# Patient Record
Sex: Male | Born: 1945 | Race: White | Hispanic: No | Smoking: Former smoker
Health system: Southern US, Community
[De-identification: ages and names within clinical notes are randomized; demographics above are authoritative.]

## PROBLEM LIST (undated history)

## (undated) DIAGNOSIS — R0602 Shortness of breath: Secondary | ICD-10-CM

## (undated) DIAGNOSIS — M1990 Unspecified osteoarthritis, unspecified site: Secondary | ICD-10-CM

## (undated) DIAGNOSIS — K224 Dyskinesia of esophagus: Secondary | ICD-10-CM

## (undated) DIAGNOSIS — R011 Cardiac murmur, unspecified: Secondary | ICD-10-CM

## (undated) DIAGNOSIS — R21 Rash and other nonspecific skin eruption: Secondary | ICD-10-CM

## (undated) DIAGNOSIS — I10 Essential (primary) hypertension: Secondary | ICD-10-CM

## (undated) HISTORY — PX: OTHER SURGICAL HISTORY: SHX169

## (undated) HISTORY — PX: HERNIA REPAIR: SHX51

---
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
## 1999-09-01 ENCOUNTER — Other Ambulatory Visit: Admission: RE | Admit: 1999-09-01 | Discharge: 1999-09-01 | Payer: Self-pay | Admitting: Orthopedic Surgery

## 2004-07-27 ENCOUNTER — Ambulatory Visit: Payer: Self-pay | Admitting: Family Medicine

## 2004-08-09 ENCOUNTER — Ambulatory Visit: Payer: Self-pay | Admitting: Family Medicine

## 2005-02-21 ENCOUNTER — Ambulatory Visit: Payer: Self-pay | Admitting: Family Medicine

## 2005-03-01 ENCOUNTER — Ambulatory Visit: Payer: Self-pay | Admitting: Family Medicine

## 2005-03-06 ENCOUNTER — Ambulatory Visit: Payer: Self-pay | Admitting: Family Medicine

## 2005-06-19 ENCOUNTER — Ambulatory Visit: Payer: Self-pay | Admitting: Family Medicine

## 2005-07-18 ENCOUNTER — Ambulatory Visit: Payer: Self-pay | Admitting: Family Medicine

## 2011-12-05 ENCOUNTER — Encounter (HOSPITAL_COMMUNITY): Payer: Self-pay | Admitting: Pharmacy Technician

## 2011-12-05 ENCOUNTER — Encounter (HOSPITAL_COMMUNITY)
Admission: RE | Admit: 2011-12-05 | Discharge: 2011-12-05 | Disposition: A | Discharge status: Home / Self Care | Payer: BC Managed Care – PPO | Source: Ambulatory Visit | Attending: Orthopedic Surgery | Admitting: Orthopedic Surgery

## 2011-12-05 ENCOUNTER — Encounter (HOSPITAL_COMMUNITY): Payer: Self-pay

## 2011-12-05 ENCOUNTER — Ambulatory Visit (HOSPITAL_COMMUNITY)
Admission: RE | Admit: 2011-12-05 | Discharge: 2011-12-05 | Disposition: A | Discharge status: Home / Self Care | Payer: BC Managed Care – PPO | Source: Ambulatory Visit | Attending: Orthopedic Surgery | Admitting: Orthopedic Surgery

## 2011-12-05 DIAGNOSIS — Z01812 Encounter for preprocedural laboratory examination: Secondary | ICD-10-CM | POA: Insufficient documentation

## 2011-12-05 DIAGNOSIS — Z01818 Encounter for other preprocedural examination: Secondary | ICD-10-CM | POA: Insufficient documentation

## 2011-12-05 DIAGNOSIS — Z87891 Personal history of nicotine dependence: Secondary | ICD-10-CM | POA: Insufficient documentation

## 2011-12-05 HISTORY — DX: Shortness of breath: R06.02

## 2011-12-05 HISTORY — DX: Rash and other nonspecific skin eruption: R21

## 2011-12-05 HISTORY — DX: Essential (primary) hypertension: I10

## 2011-12-05 HISTORY — DX: Dyskinesia of esophagus: K22.4

## 2011-12-05 HISTORY — DX: Unspecified osteoarthritis, unspecified site: M19.90

## 2011-12-05 LAB — DIFFERENTIAL
Basophils Relative: 1 % (ref 0–1)
Lymphocytes Relative: 31 % (ref 12–46)
Lymphs Abs: 2.7 10*3/uL (ref 0.7–4.0)
Monocytes Relative: 8 % (ref 3–12)
Neutro Abs: 5 10*3/uL (ref 1.7–7.7)
Neutrophils Relative %: 58 % (ref 43–77)

## 2011-12-05 LAB — URINALYSIS, ROUTINE W REFLEX MICROSCOPIC
Ketones, ur: NEGATIVE mg/dL
Leukocytes, UA: NEGATIVE
Nitrite: NEGATIVE
Protein, ur: NEGATIVE mg/dL
pH: 6 (ref 5.0–8.0)

## 2011-12-05 LAB — APTT: aPTT: 30 seconds (ref 24–37)

## 2011-12-05 LAB — SURGICAL PCR SCREEN
MRSA, PCR: NEGATIVE
Staphylococcus aureus: NEGATIVE

## 2011-12-05 LAB — BASIC METABOLIC PANEL
BUN: 14 mg/dL (ref 6–23)
CO2: 29 mEq/L (ref 19–32)
Chloride: 99 mEq/L (ref 96–112)
GFR calc Af Amer: >90 mL/min (ref >90)
Potassium: 3.5 mEq/L (ref 3.5–5.1)

## 2011-12-05 LAB — CBC
Hemoglobin: 15.1 g/dL (ref 13.0–17.0)
MCHC: 33.8 g/dL (ref 30.0–36.0)
RBC: 4.84 MIL/uL (ref 4.22–5.81)
WBC: 8.7 10*3/uL (ref 4.0–10.5)

## 2011-12-05 NOTE — Pre-Procedure Instructions (Signed)
EKG REPORT AND OFFICE NOTE 12/03/11 WITH MEDICAL CLEARANC FOR LEFT HIP REPLACEMENT ON THIS CHART FROM DR. DOUGH IN [place]. CXR, CBC WITH DIFF, BMET, PT, PTT, UA WERE DONE TODAY PREOP.

## 2011-12-05 NOTE — Patient Instructions (Addendum)
YOUR SURGERY IS SCHEDULED ON:  Tuesday  4/2  AT 2:00 PM  REPORT TO [facility] AT:  11:30 AM      PHONE # FOR SHORT STAY IS [phone]  DO NOT EAT ANYTHING AFTER MIDNIGHT THE NIGHT BEFORE YOUR SURGERY.  YOU MAY BRUSH YOUR TEETH, -- NO FOOD, NO CHEWING GUM, NO MINTS, NO CANDIES, NO CHEWING TOBACCO.   YOU MAY HAVE CLEAR LIQUIDS TO DRINK FROM MIDNIGHT UNTIL 8:00 AM THE DAY OF YOUR SURGERY --LIKE WATER, BLACK COFFEE- NOTHING TO DRINK AFTER 8:00 AM.  PLEASE TAKE THE FOLLOWING MEDICATIONS THE AM OF SURGERY:  HYDROCODONE FOR PAIN AND LIPITOR.    IF YOU USE INHALERS--USE YOUR INHALERS THE AM OF YOUR SURGERY AND BRING INHALERS TO THE HOSPITAL -TAKE TO SURGERY.    IF YOU ARE DIABETIC:  DO NOT TAKE ANY DIABETIC MEDICATIONS THE AM OF YOUR SURGERY.  IF YOU TAKE INSULIN IN THE EVENINGS--PLEASE ONLY TAKE 1/2 NORMAL EVENING DOSE THE NIGHT BEFORE YOUR SURGERY.  NO INSULIN THE AM OF YOUR SURGERY.  IF YOU HAVE SLEEP APNEA AND USE CPAP OR BIPAP--PLEASE BRING THE MASK --NOT THE MACHINE-NOT THE TUBING   -JUST THE MASK. DO NOT BRING VALUABLES, MONEY, CREDIT CARDS.  CONTACT LENS, DENTURES / PARTIALS, GLASSES SHOULD NOT BE WORN TO SURGERY AND IN MOST CASES-HEARING AIDS WILL NEED TO BE REMOVED.  BRING YOUR GLASSES CASE, ANY EQUIPMENT NEEDED FOR YOUR CONTACT LENS. FOR PATIENTS ADMITTED TO THE HOSPITAL--CHECK OUT TIME THE DAY OF DISCHARGE IS 11:00 AM.  ALL INPATIENT ROOMS ARE PRIVATE - WITH BATHROOM, TELEPHONE, TELEVISION AND WIFI INTERNET. IF YOU ARE BEING DISCHARGED THE SAME DAY OF YOUR SURGERY--YOU CAN NOT DRIVE YOURSELF HOME--AND SHOULD NOT GO HOME ALONE BY TAXI OR BUS.  NO DRIVING OR OPERATING MACHINERY FOR 24 HOURS FOLLOWING ANESTHESIA / PAIN MEDICATIONS.                            SPECIAL INSTRUCTIONS:  CHLORHEXIDINE SOAP SHOWER (other brand names are Betasept and Hibiclens ) PLEASE SHOWER WITH CHLORHEXIDINE THE NIGHT BEFORE YOUR SURGERY AND THE AM OF YOUR SURGERY. DO NOT USE CHLORHEXIDINE ON  YOUR FACE OR PRIVATE AREAS--YOU MAY USE YOUR NORMAL SOAP THOSE AREAS AND YOUR NORMAL SHAMPOO.  WOMEN SHOULD AVOID SHAVING UNDER ARMS AND SHAVING LEGS 48 HOURS BEFORE USING CHLORHEXIDINE TO AVOID SKIN IRRITATION.  DO NOT USE IF ALLERGIC TO CHLORHEXIDINE.  PLEASE READ OVER ANY  FACT SHEETS THAT YOU WERE GIVEN: MRSA INFORMATION, BLOOD TRANSFUSION INFORMATION, INCENTIVE SPIROMETER INFORMATION.   PT NOTIFIED BY PHONE OF SURGERY TIME CHANGE TO 1:45 PM--PLEASE ARRIVE TO SHORT STAY AT 11:15 AM  AND NO FOOD AFTER MIDNIGHT--BUT CLEAR LIQUIDS FROM MN UNTIL 7:30 AM DAY OF SURGERY.  Pt notified by phone that his surgery time changed again to 13:25pm--he is to arrive to short stay tomorrow by 10:00am--reminded no food after midnight tonight--clear liquids from mn until 6:00 am day of surgery--nothing after 6:00am.

## 2011-12-12 NOTE — Progress Notes (Signed)
H&P performed 12/12/11 Dictation # [id number]

## 2011-12-13 NOTE — H&P (Signed)
NAMEALDAIR, [name] NO.:  [id number]  MEDICAL RECORD NO.:  [id number]  LOCATION:                               FACILITY:  [facility]  PHYSICIAN:  Madlyn Frankel. Charlann Boxer, M.D.  DATE OF BIRTH:  Apr 11, 1946  DATE OF ADMISSION:  12/18/2011 DATE OF DISCHARGE:                             HISTORY & PHYSICAL   DATE OF SURGERY:  December 18, 2011.  ADMITTING DIAGNOSIS:  End-stage osteoarthritis, left hip.  HISTORY OF PRESENT ILLNESS:  This is a 66 year old gentleman with a history of osteoarthritis of his left hip that failed conservative management.  After discussion of treatments, benefits, risks and options, the patient is now scheduled for a total hip arthroplasty of the left hip.  Note that his primary care doctor is Dr. Dina Rich. He is a candidate for trans-Cinnamic acid and dexamethasone, will receive that at surgery.  He plans on going home after surgery.  He is given his home medications of aspirin, iron MiraLAX, Robaxin and Colace.  ALLERGIES:  Drug allergies none.  CURRENT MEDICATIONS:  Atorvastatin 10 mg once a day, Exforge hydrochlorothiazide 10/320/25 one daily, aspirin 81 mg daily, vitamin C 1000 mg daily, Centrum Silver 1 daily and hydrocodone 5 mg 1-2 daily.  SERIOUS MEDICAL ILLNESSES:  Include hypertension, hyperlipidemia, history of shingles.  PREVIOUS SURGERIES:  Include Dupuytren's contracture of both hands and herniorrhaphy.  FAMILY HISTORY:  Negative.  SOCIAL HISTORY:  The patient is divorced.  He is a [employer] fast [employer].  He does not smoke or drink.  Again he plans on going home after surgery.  REVIEW OF SYSTEMS:  Central nervous system negative for headache, blurred vision, or dizziness.  Pulmonary negative for shortness of breath, PND, orthopnea;  positive for exertional shortness of breath. Cardiovascular negative for chest pain or palpitation.  GI negative for ulcers, hepatitis.  GU negative for urinary difficulties musculoskeletal in  HPI.  PHYSICAL EXAMINATION:  BP 135/79, pulse 72 and regular, respirations 14. HEENT:  Head normocephalic.  Nose patent.  Ears patent.  Pupils equal, round, reactive to light.  Throat without injection.  NECK:  Supple without adenopathy.  Carotids 2+ without bruit.  CHEST:  Clear to auscultation.  No rales or rhonchi.  Respirations 14.  HEART:  Regular rate and rhythm at 73 beats per minute without murmur.  ABDOMEN:  Soft. Active bowel sounds.  No masses or organomegaly.  NEUROLOGIC:  Alert, oriented to time, place, person.  Cranial nerves 2-12 grossly intact. EXTREMITIES:  Shows the left hip with markedly decreased range of motion with full extension, further flexion to 85 degrees, external rotation of 20 degrees,  internal rotation in neutral.  NEUROVASCULAR:  Status intact.  ASSESSMENT:  End-stage osteoarthritis, left hip.  PLAN:  Total hip arthroplasty by anterior approach, left hip.     [name], P.A.   ______________________________ Madlyn Frankel Charlann Boxer, M.D.    SJC/MEDQ  D:  12/12/2011  T:  12/13/2011  Job:  [id number]

## 2011-12-13 NOTE — Pre-Procedure Instructions (Signed)
PT NOTIFIED BY PHONE THAT HIS SURGERY TIME WAS MOVED UP 15 MINUTES  - FROM 2PM TO 1:45 PM--PLEASE ARRIVE TO SHORT STAY BY 11:15 AM --REMINDED NO FOOD AFTER MIDNIGHT NIGHT BEFORE SURGERY  AND JUST DO CLEAR LIQUIDS FROM MIDNIGHT UNTIL 7:30 AM DAY OF SURGERY--PT VOICED UNDERSTANDING.

## 2011-12-17 NOTE — Pre-Procedure Instructions (Signed)
Pt  Notified his surgery time has changed again--surgery is now at 12:25 pm and he is to arrive to short stay by 10:00am, no food after midnight tonight--clear liquids from mn until 6:00am--nothing to drink after 6am.  Pt voiced understanding and wrote down his instructions.

## 2011-12-18 ENCOUNTER — Encounter (HOSPITAL_COMMUNITY): Payer: Self-pay | Admitting: Orthopedic Surgery

## 2011-12-18 ENCOUNTER — Inpatient Hospital Stay (HOSPITAL_COMMUNITY)
Admission: RE | Admit: 2011-12-18 | Discharge: 2011-12-19 | DRG: 818 | Disposition: A | Discharge status: Home Health | Payer: BC Managed Care – PPO | Source: Ambulatory Visit | Attending: Orthopedic Surgery | Admitting: Orthopedic Surgery

## 2011-12-18 ENCOUNTER — Ambulatory Visit (HOSPITAL_COMMUNITY): Payer: BC Managed Care – PPO

## 2011-12-18 ENCOUNTER — Encounter (HOSPITAL_COMMUNITY): Payer: Self-pay | Admitting: Anesthesiology

## 2011-12-18 ENCOUNTER — Encounter (HOSPITAL_COMMUNITY)
Admission: RE | Disposition: A | Discharge status: Home Health | Payer: Self-pay | Source: Ambulatory Visit | Attending: Orthopedic Surgery

## 2011-12-18 ENCOUNTER — Ambulatory Visit (HOSPITAL_COMMUNITY): Payer: BC Managed Care – PPO | Admitting: Anesthesiology

## 2011-12-18 DIAGNOSIS — M1610 Unilateral primary osteoarthritis, unspecified hip: Principal | ICD-10-CM | POA: Diagnosis present

## 2011-12-18 DIAGNOSIS — Z96649 Presence of unspecified artificial hip joint: Secondary | ICD-10-CM

## 2011-12-18 DIAGNOSIS — I10 Essential (primary) hypertension: Secondary | ICD-10-CM | POA: Diagnosis present

## 2011-12-18 DIAGNOSIS — M169 Osteoarthritis of hip, unspecified: Principal | ICD-10-CM | POA: Diagnosis present

## 2011-12-18 HISTORY — PX: TOTAL HIP ARTHROPLASTY: SHX124

## 2011-12-18 SURGERY — ARTHROPLASTY, HIP, TOTAL, ANTERIOR APPROACH
Anesthesia: General | Site: Hip | Laterality: Left | Wound class: Clean

## 2011-12-18 MED ORDER — LIDOCAINE HCL (CARDIAC) 20 MG/ML IV SOLN
INTRAVENOUS | Status: DC | PRN
  Administered 2011-12-18: 100 mg via INTRAVENOUS

## 2011-12-18 MED ORDER — HYDROCHLOROTHIAZIDE 25 MG PO TABS
25.0000 mg | ORAL_TABLET | Freq: Every day | ORAL | Status: DC
  Administered 2011-12-19: 25 mg via ORAL
  Filled 2011-12-18 (×2): qty 1

## 2011-12-18 MED ORDER — FLEET ENEMA 7-19 GM/118ML RE ENEM: 1.0000 | ENEMA | Freq: Once | RECTAL | Status: AC | PRN

## 2011-12-18 MED ORDER — PROPOFOL 10 MG/ML IV EMUL
INTRAVENOUS | Status: DC | PRN
  Administered 2011-12-18: 200 mg via INTRAVENOUS

## 2011-12-18 MED ORDER — DIPHENHYDRAMINE HCL 25 MG PO CAPS: 25.0000 mg | ORAL_CAPSULE | Freq: Four times a day (QID) | ORAL | Status: DC | PRN

## 2011-12-18 MED ORDER — NEOSTIGMINE METHYLSULFATE 1 MG/ML IJ SOLN
INTRAMUSCULAR | Status: DC | PRN
  Administered 2011-12-18: 5 mg via INTRAVENOUS

## 2011-12-18 MED ORDER — CHLORHEXIDINE GLUCONATE 4 % EX LIQD: 60.0000 mL | Freq: Once | CUTANEOUS | Status: DC

## 2011-12-18 MED ORDER — CEFAZOLIN SODIUM-DEXTROSE 2-3 GM-% IV SOLR
2.0000 g | Freq: Four times a day (QID) | INTRAVENOUS | Status: AC
  Administered 2011-12-18 – 2011-12-19 (×3): 2 g via INTRAVENOUS
  Filled 2011-12-18 (×3): qty 50

## 2011-12-18 MED ORDER — SODIUM CHLORIDE 0.9 % IV SOLN
100.0000 mL/h | INTRAVENOUS | Status: DC
  Administered 2011-12-18 – 2011-12-19 (×2): 100 mL/h via INTRAVENOUS
  Filled 2011-12-18 (×8): qty 1000

## 2011-12-18 MED ORDER — AMLODIPINE BESYLATE 10 MG PO TABS
10.0000 mg | ORAL_TABLET | Freq: Every day | ORAL | Status: DC
  Administered 2011-12-19: 10 mg via ORAL
  Filled 2011-12-18 (×2): qty 1

## 2011-12-18 MED ORDER — ACETAMINOPHEN 10 MG/ML IV SOLN
INTRAVENOUS | Status: AC
  Filled 2011-12-18: qty 100

## 2011-12-18 MED ORDER — METOCLOPRAMIDE HCL 10 MG PO TABS: 5.0000 mg | ORAL_TABLET | Freq: Three times a day (TID) | ORAL | Status: DC | PRN

## 2011-12-18 MED ORDER — METHOCARBAMOL 100 MG/ML IJ SOLN
500.0000 mg | Freq: Four times a day (QID) | INTRAVENOUS | Status: DC | PRN
  Administered 2011-12-18 – 2011-12-19 (×2): 500 mg via INTRAVENOUS
  Filled 2011-12-18 (×2): qty 5

## 2011-12-18 MED ORDER — HYDROMORPHONE HCL PF 1 MG/ML IJ SOLN
0.5000 mg | INTRAMUSCULAR | Status: DC | PRN
  Administered 2011-12-18 – 2011-12-19 (×2): 1 mg via INTRAVENOUS
  Filled 2011-12-18 (×2): qty 1

## 2011-12-18 MED ORDER — PHENOL 1.4 % MT LIQD
1.0000 | OROMUCOSAL | Status: DC | PRN
  Filled 2011-12-18: qty 177

## 2011-12-18 MED ORDER — FENTANYL CITRATE 0.05 MG/ML IJ SOLN
INTRAMUSCULAR | Status: AC
  Filled 2011-12-18: qty 2

## 2011-12-18 MED ORDER — ONDANSETRON HCL 4 MG/2ML IJ SOLN: 4.0000 mg | Freq: Four times a day (QID) | INTRAMUSCULAR | Status: DC | PRN

## 2011-12-18 MED ORDER — DOCUSATE SODIUM 100 MG PO CAPS
100.0000 mg | ORAL_CAPSULE | Freq: Two times a day (BID) | ORAL | Status: DC
  Administered 2011-12-18 – 2011-12-19 (×2): 100 mg via ORAL
  Filled 2011-12-18 (×2): qty 1

## 2011-12-18 MED ORDER — PROMETHAZINE HCL 25 MG/ML IJ SOLN: 6.2500 mg | INTRAMUSCULAR | Status: DC | PRN

## 2011-12-18 MED ORDER — RIVAROXABAN 10 MG PO TABS
10.0000 mg | ORAL_TABLET | ORAL | Status: DC
  Administered 2011-12-19: 10 mg via ORAL
  Filled 2011-12-18 (×2): qty 1

## 2011-12-18 MED ORDER — CEFAZOLIN SODIUM-DEXTROSE 2-3 GM-% IV SOLR
2.0000 g | Freq: Once | INTRAVENOUS | Status: AC
  Administered 2011-12-18: 2 g via INTRAVENOUS

## 2011-12-18 MED ORDER — CEFAZOLIN SODIUM-DEXTROSE 2-3 GM-% IV SOLR
INTRAVENOUS | Status: AC
  Filled 2011-12-18: qty 50

## 2011-12-18 MED ORDER — AMLODIPINE BESYLATE 10 MG PO TABS
10.0000 mg | ORAL_TABLET | Freq: Once | ORAL | Status: DC
  Filled 2011-12-18: qty 1

## 2011-12-18 MED ORDER — METHOCARBAMOL 500 MG PO TABS
500.0000 mg | ORAL_TABLET | Freq: Four times a day (QID) | ORAL | Status: DC | PRN
  Administered 2011-12-18: 500 mg via ORAL
  Filled 2011-12-18: qty 1

## 2011-12-18 MED ORDER — AMLODIPINE-VALSARTAN-HCTZ 10-320-25 MG PO TABS: ORAL_TABLET | Freq: Every morning | ORAL | Status: DC

## 2011-12-18 MED ORDER — MENTHOL 3 MG MT LOZG
1.0000 | LOZENGE | OROMUCOSAL | Status: DC | PRN
  Filled 2011-12-18: qty 9

## 2011-12-18 MED ORDER — HYDROMORPHONE HCL PF 1 MG/ML IJ SOLN
0.2500 mg | INTRAMUSCULAR | Status: DC | PRN
Start: 2011-12-18 — End: 2011-12-18
  Administered 2011-12-18 (×4): 0.5 mg via INTRAVENOUS

## 2011-12-18 MED ORDER — DEXAMETHASONE SODIUM PHOSPHATE 10 MG/ML IJ SOLN: 10.0000 mg | Freq: Once | INTRAMUSCULAR | Status: DC

## 2011-12-18 MED ORDER — ATORVASTATIN CALCIUM 10 MG PO TABS
10.0000 mg | ORAL_TABLET | Freq: Every day | ORAL | Status: DC
  Administered 2011-12-19: 10 mg via ORAL
  Filled 2011-12-18 (×3): qty 1

## 2011-12-18 MED ORDER — EPHEDRINE SULFATE 50 MG/ML IJ SOLN
INTRAMUSCULAR | Status: DC | PRN
  Administered 2011-12-18: 5 mg via INTRAVENOUS

## 2011-12-18 MED ORDER — ACETAMINOPHEN 10 MG/ML IV SOLN
INTRAVENOUS | Status: DC | PRN
  Administered 2011-12-18: 1000 mg via INTRAVENOUS

## 2011-12-18 MED ORDER — NAPHAZOLINE HCL 0.1 % OP SOLN
1.0000 [drp] | Freq: Four times a day (QID) | OPHTHALMIC | Status: DC | PRN
  Filled 2011-12-18: qty 15

## 2011-12-18 MED ORDER — HYDROMORPHONE HCL PF 1 MG/ML IJ SOLN
INTRAMUSCULAR | Status: AC
  Filled 2011-12-18: qty 2

## 2011-12-18 MED ORDER — LACTATED RINGERS IV SOLN
INTRAVENOUS | Status: DC | PRN
  Administered 2011-12-18: 13:00:00 via INTRAVENOUS

## 2011-12-18 MED ORDER — FENTANYL CITRATE 0.05 MG/ML IJ SOLN
INTRAMUSCULAR | Status: DC | PRN
  Administered 2011-12-18 (×3): 50 ug via INTRAVENOUS
  Administered 2011-12-18: 100 ug via INTRAVENOUS

## 2011-12-18 MED ORDER — ONDANSETRON HCL 4 MG PO TABS: 4.0000 mg | ORAL_TABLET | Freq: Four times a day (QID) | ORAL | Status: DC | PRN

## 2011-12-18 MED ORDER — MIDAZOLAM HCL 5 MG/5ML IJ SOLN
INTRAMUSCULAR | Status: DC | PRN
  Administered 2011-12-18: 2 mg via INTRAVENOUS

## 2011-12-18 MED ORDER — IRBESARTAN 300 MG PO TABS
300.0000 mg | ORAL_TABLET | Freq: Every day | ORAL | Status: DC
  Administered 2011-12-19: 300 mg via ORAL
  Filled 2011-12-18 (×2): qty 1

## 2011-12-18 MED ORDER — HYDROCORTISONE 1 % EX CREA
1.0000 "application " | TOPICAL_CREAM | Freq: Two times a day (BID) | CUTANEOUS | Status: DC | PRN
  Filled 2011-12-18: qty 28

## 2011-12-18 MED ORDER — BISACODYL 5 MG PO TBEC: 5.0000 mg | DELAYED_RELEASE_TABLET | Freq: Every day | ORAL | Status: DC | PRN

## 2011-12-18 MED ORDER — DEXAMETHASONE SODIUM PHOSPHATE 10 MG/ML IJ SOLN
10.0000 mg | Freq: Once | INTRAMUSCULAR | Status: AC
  Administered 2011-12-19: 10 mg via INTRAVENOUS
  Filled 2011-12-18: qty 1

## 2011-12-18 MED ORDER — ZOLPIDEM TARTRATE 5 MG PO TABS: 5.0000 mg | ORAL_TABLET | Freq: Every evening | ORAL | Status: DC | PRN

## 2011-12-18 MED ORDER — GLYCOPYRROLATE 0.2 MG/ML IJ SOLN
INTRAMUSCULAR | Status: DC | PRN
  Administered 2011-12-18: 0.2 mg via INTRAVENOUS
  Administered 2011-12-18: .8 mg via INTRAVENOUS

## 2011-12-18 MED ORDER — 0.9 % SODIUM CHLORIDE (POUR BTL) OPTIME
TOPICAL | Status: DC | PRN
  Administered 2011-12-18: 1000 mL

## 2011-12-18 MED ORDER — DEXAMETHASONE SODIUM PHOSPHATE 10 MG/ML IJ SOLN
INTRAMUSCULAR | Status: DC | PRN
  Administered 2011-12-18: 10 mg via INTRAVENOUS

## 2011-12-18 MED ORDER — FENTANYL CITRATE 0.05 MG/ML IJ SOLN
25.0000 ug | INTRAMUSCULAR | Status: DC | PRN
  Administered 2011-12-18: 25 ug via INTRAVENOUS
  Administered 2011-12-18: 50 ug via INTRAVENOUS
  Administered 2011-12-18: 25 ug via INTRAVENOUS

## 2011-12-18 MED ORDER — FERROUS SULFATE 325 (65 FE) MG PO TABS
325.0000 mg | ORAL_TABLET | Freq: Three times a day (TID) | ORAL | Status: DC
  Administered 2011-12-18 – 2011-12-19 (×3): 325 mg via ORAL
  Filled 2011-12-18 (×3): qty 1

## 2011-12-18 MED ORDER — TRANEXAMIC ACID 100 MG/ML IV SOLN
1600.0000 mg | Freq: Once | INTRAVENOUS | Status: AC
  Administered 2011-12-18: 1600 mg via INTRAVENOUS
  Filled 2011-12-18: qty 16

## 2011-12-18 MED ORDER — ROCURONIUM BROMIDE 100 MG/10ML IV SOLN
INTRAVENOUS | Status: DC | PRN
  Administered 2011-12-18: 50 mg via INTRAVENOUS
  Administered 2011-12-18 (×2): 10 mg via INTRAVENOUS

## 2011-12-18 MED ORDER — ALUM & MAG HYDROXIDE-SIMETH 200-200-20 MG/5ML PO SUSP: 30.0000 mL | ORAL | Status: DC | PRN

## 2011-12-18 MED ORDER — METOCLOPRAMIDE HCL 5 MG/ML IJ SOLN: 5.0000 mg | Freq: Three times a day (TID) | INTRAMUSCULAR | Status: DC | PRN

## 2011-12-18 MED ORDER — HYDROCODONE-ACETAMINOPHEN 7.5-325 MG PO TABS
1.0000 | ORAL_TABLET | ORAL | Status: DC
  Administered 2011-12-18 – 2011-12-19 (×6): 2 via ORAL
  Filled 2011-12-18 (×6): qty 2

## 2011-12-18 MED ORDER — POLYETHYLENE GLYCOL 3350 17 G PO PACK
17.0000 g | PACK | Freq: Two times a day (BID) | ORAL | Status: DC
  Administered 2011-12-18 – 2011-12-19 (×2): 17 g via ORAL
  Filled 2011-12-18 (×2): qty 1

## 2011-12-18 MED ORDER — ONDANSETRON HCL 4 MG/2ML IJ SOLN
INTRAMUSCULAR | Status: DC | PRN
  Administered 2011-12-18: 4 mg via INTRAVENOUS

## 2011-12-18 MED ORDER — HYDROMORPHONE HCL PF 1 MG/ML IJ SOLN
INTRAMUSCULAR | Status: DC | PRN
  Administered 2011-12-18: 1 mg via INTRAVENOUS
  Administered 2011-12-18 (×2): 0.5 mg via INTRAVENOUS

## 2011-12-18 SURGICAL SUPPLY — 38 items
BAG ZIPLOCK 12X15 (MISCELLANEOUS) ×4 IMPLANT
BLADE SAW SGTL 18X1.27X75 (BLADE) ×2 IMPLANT
CELLS DAT CNTRL 66122 CELL SVR (MISCELLANEOUS) ×1 IMPLANT
CLOTH BEACON ORANGE TIMEOUT ST (SAFETY) ×2 IMPLANT
DERMABOND ADVANCED (GAUZE/BANDAGES/DRESSINGS) ×1
DERMABOND ADVANCED .7 DNX12 (GAUZE/BANDAGES/DRESSINGS) ×1 IMPLANT
DRAPE C-ARM 42X72 X-RAY (DRAPES) ×2 IMPLANT
DRAPE STERI IOBAN 125X83 (DRAPES) ×2 IMPLANT
DRAPE U-SHAPE 47X51 STRL (DRAPES) ×6 IMPLANT
DRSG AQUACEL AG ADV 3.5X10 (GAUZE/BANDAGES/DRESSINGS) ×2 IMPLANT
DRSG TEGADERM 4X4.75 (GAUZE/BANDAGES/DRESSINGS) ×2 IMPLANT
DURAPREP 26ML APPLICATOR (WOUND CARE) ×2 IMPLANT
ELECT BLADE TIP CTD 4 INCH (ELECTRODE) ×2 IMPLANT
ELECT REM PT RETURN 9FT ADLT (ELECTROSURGICAL) ×2
ELECTRODE REM PT RTRN 9FT ADLT (ELECTROSURGICAL) ×1 IMPLANT
EVACUATOR 1/8 PVC DRAIN (DRAIN) ×2 IMPLANT
FACESHIELD LNG OPTICON STERILE (SAFETY) ×8 IMPLANT
GAUZE SPONGE 2X2 8PLY STRL LF (GAUZE/BANDAGES/DRESSINGS) ×1 IMPLANT
GLOVE BIOGEL PI IND STRL 7.5 (GLOVE) ×1 IMPLANT
GLOVE BIOGEL PI IND STRL 8 (GLOVE) ×1 IMPLANT
GLOVE BIOGEL PI INDICATOR 7.5 (GLOVE) ×1
GLOVE BIOGEL PI INDICATOR 8 (GLOVE) ×1
GLOVE ECLIPSE 8.0 STRL XLNG CF (GLOVE) ×2 IMPLANT
GLOVE ORTHO TXT STRL SZ7.5 (GLOVE) ×4 IMPLANT
GOWN BRE IMP PREV XXLGXLNG (GOWN DISPOSABLE) ×4 IMPLANT
GOWN STRL NON-REIN LRG LVL3 (GOWN DISPOSABLE) ×2 IMPLANT
KIT BASIN OR (CUSTOM PROCEDURE TRAY) ×2 IMPLANT
PACK TOTAL JOINT (CUSTOM PROCEDURE TRAY) ×2 IMPLANT
PADDING CAST COTTON 6X4 STRL (CAST SUPPLIES) ×2 IMPLANT
RTRCTR WOUND ALEXIS 18CM MED (MISCELLANEOUS) ×2
SPONGE GAUZE 2X2 STER 10/PKG (GAUZE/BANDAGES/DRESSINGS) ×1
SUCTION FRAZIER 12FR DISP (SUCTIONS) ×2 IMPLANT
SUT MNCRL AB 4-0 PS2 18 (SUTURE) ×2 IMPLANT
SUT VIC AB 1 CT1 36 (SUTURE) ×8 IMPLANT
SUT VIC AB 2-0 CT1 27 (SUTURE) ×3
SUT VIC AB 2-0 CT1 TAPERPNT 27 (SUTURE) ×3 IMPLANT
TOWEL OR 17X26 10 PK STRL BLUE (TOWEL DISPOSABLE) ×4 IMPLANT
TRAY FOLEY CATH 14FRSI W/METER (CATHETERS) ×2 IMPLANT

## 2011-12-18 NOTE — Progress Notes (Signed)
Pt informed that his surgery will be delayed. Will keep pt and family updated when holding area informs [place]. They verbalize understanding.

## 2011-12-18 NOTE — Transfer of Care (Signed)
Immediate Anesthesia Transfer of Care Note  Patient: [name]  Procedure(s) Performed: Procedure(s) (LRB): TOTAL HIP ARTHROPLASTY ANTERIOR APPROACH (Left)  Patient Location: PACU  Anesthesia Type: General  Level of Consciousness: awake, alert , oriented and patient cooperative  Airway & Oxygen Therapy: Patient Spontanous Breathing and Patient connected to face mask oxygen  Post-op Assessment: Report given to PACU RN, Post -op Vital signs reviewed and stable and Patient moving all extremities  Post vital signs: Reviewed and stable  Complications: No apparent anesthesia complications

## 2011-12-18 NOTE — Anesthesia Preprocedure Evaluation (Addendum)
Anesthesia Evaluation  Patient identified by MRN, date of birth, ID band Patient awake    Reviewed: Allergy & Precautions, H&P , NPO status , Patient's Chart, lab work & pertinent test results  Airway Mallampati: II TM Distance: >3 FB Neck ROM: Full    Dental No notable dental hx.    Pulmonary neg pulmonary ROS,  breath sounds clear to auscultation  Pulmonary exam normal       Cardiovascular hypertension, Pt. on medications Rhythm:Regular Rate:Normal     Neuro/Psych negative neurological ROS  negative psych ROS   GI/Hepatic negative GI ROS, Neg liver ROS,   Endo/Other  negative endocrine ROS  Renal/GU negative Renal ROS  negative genitourinary   Musculoskeletal negative musculoskeletal ROS (+)   Abdominal   Peds negative pediatric ROS (+)  Hematology negative hematology ROS (+)   Anesthesia Other Findings   Reproductive/Obstetrics negative OB ROS                           Anesthesia Physical Anesthesia Plan  ASA: II  Anesthesia Plan: General   Post-op Pain Management:    Induction: Intravenous  Airway Management Planned: Oral ETT  Additional Equipment:   Intra-op Plan:   Post-operative Plan: Extubation in OR  Informed Consent: I have reviewed the patients History and Physical, chart, labs and discussed the procedure including the risks, benefits and alternatives for the proposed anesthesia with the patient or authorized representative who has indicated his/her understanding and acceptance.   Dental advisory given  Plan Discussed with: CRNA  Anesthesia Plan Comments:         Anesthesia Quick Evaluation  

## 2011-12-18 NOTE — Op Note (Signed)
NAME:  [name]                ACCOUNT NO.: [id number]      MEDICAL RECORD NO.: [id number]      FACILITY:  [facility]      PHYSICIAN:  Vaani Morren D  DATE OF BIRTH:  Nov 29, 1945     DATE OF PROCEDURE:  12/18/2011                                 OPERATIVE REPORT         PREOPERATIVE DIAGNOSIS: Left  hip osteoarthritis.      POSTOPERATIVE DIAGNOSIS:  Left hip osteoarthritis.      PROCEDURE:  Left total hip replacement through an anterior approach   utilizing DePuy THR system, component size 54 pinnacle cup, a size 54 neutral   Altrex liner, a size 9Hi Tri Lock stem with a 36+1.5 delta ceramic   ball.      SURGEON:  Madlyn Frankel. Charlann Boxer, M.D.      ASSISTANT:  Lanney Gins, PA      ANESTHESIA:  General.      SPECIMENS:  None.      COMPLICATIONS:  None.      BLOOD LOSS:  350 cc     DRAINS:  One Hemovac.      INDICATION OF THE PROCEDURE:  [name] is a 66 y.o. male who had   presented to office for evaluation of left hip pain.  Radiographs revealed   progressive degenerative changes with bone-on-bone   articulation to the  hip joint.  The patient had painful limited range of   motion significantly affecting their overall quality of life.  The patient was failing to    respond to conservative measures, and at this point was ready   to proceed with more definitive measures.  The patient has noted progressive   degenerative changes in his hip, progressive problems and dysfunction   with regarding the hip prior to surgery.  Consent was obtained for   benefit of pain relief.  Specific risk of infection, DVT, component   failure, dislocation, need for revision surgery, as well discussion of   the anterior versus posterior approach were reviewed.  Consent was   obtained for benefit of anterior pain relief through an anterior   approach.      PROCEDURE IN DETAIL:  The patient was brought to operative theater.   Once adequate anesthesia, preoperative antibiotics, 2gm Ancef  administered.   The patient was positioned supine on the OSI Hanna table.  Once adequate   padding of boney process was carried out, we had predraped out the hip, and  used fluoroscopy to confirm orientation of the pelvis and position.      The left hip was then prepped and draped from proximal iliac crest to   mid thigh with shower curtain technique.      Time-out was performed identifying the patient, planned procedure, and   extremity.     An incision was then made 2 cm distal and lateral to the   anterior superior iliac spine extending over the orientation of the   tensor fascia lata muscle and sharp dissection was carried down to the   fascia of the muscle and protractor placed in the soft tissues.      The fascia was then incised.  The muscle belly was identified and swept   laterally  and retractor placed along the superior neck.  Following   cauterization of the circumflex vessels and removing some pericapsular   fat, a second cobra retractor was placed on the inferior neck.  A third   retractor was placed on the anterior acetabulum after elevating the   anterior rectus.  A L-capsulotomy was along the line of the   superior neck to the trochanteric fossa, then extended proximally and   distally.  Tag sutures were placed and the retractors were then placed   intracapsular.  We then identified the trochanteric fossa and   orientation of my neck cut, confirmed this radiographically   and then made a neck osteotomy with the femur on traction.  The femoral   head was removed without difficulty or complication.  Traction was let   off and retractors were placed posterior and anterior around the   acetabulum.      The labrum and foveal tissue were debrided.  I began reaming with a 45mm   reamer and reamed up to 53mm reamer with good bony bed preparation and a 54   cup was chosen.  The final 54mm Pinnacle cup was then impacted under fluoroscopy  to confirm the depth of penetration and  orientation with respect to   abduction.  A screw was placed followed by the hole eliminator.  The final   36+4 Altrex liner was impacted with good visualized rim fit.  The cup was positioned anatomically within the acetabular portion of the pelvis.      At this point, the femur was rolled at 80 degrees.  Further capsule was   released off the inferior aspect of the femoral neck.  I then   released the superior capsule proximally.  The hook was placed laterally   along the femur and elevated manually and held in position with the bed   hook.  The leg was then extended and adducted with the leg rolled to 100   degrees of external rotation.  Once the proximal femur was fully   exposed, I used a box osteotome to set orientation.  I then began   broaching with the starting chili pepper broach and passed this by hand and then broached up to 9.  With the 9 broach in place I chose a high offset neck, a 36+1.5 trial ball and did a trial reduction.  The offset was appropriate, leg lengths   appeared to be equal, confirmed radiographically.   Given these findings, I went ahead and dislocated the hip, repositioned all   retractors and positioned the right hip in the extended and abducted position.  The final 9 Hi Tri Lock stem was   chosen and it was impacted down to the level of neck cut.  Based on this   and the trial reduction, a 36+1.5 delta ceramic ball was chosen and   impacted onto a clean and dry trunnion, and the hip was reduced.  The   hip had been irrigated throughout the case again at this point.  I did   reapproximate the superior capsular leaflet to the anterior leaflet   using #1 Vicryl, placed a medium Hemovac drain deep.  The fascia of the   tensor fascia lata muscle was then reapproximated using #1 Vicryl.  The   remaining wound was closed with 2-0 Vicryl and running 4-0 Monocryl.   The hip was cleaned, dried, and dressed sterilely using Dermabond and   Aquacel dressing.  Drain site  dressed separately.  She was then brought   to recovery room in stable condition tolerating the procedure well.    Danae Orleans, PA-C was present for the entirety of the case involved from   preoperative positioning, perioperative retractor management, general   facilitation of the case, as well as primary wound closure as assistant.            Pietro Cassis Alvan Dame, M.D.            MDO/MEDQ  D:  07/10/2011  T:  07/10/2011  Job:  [id number]      Electronically Signed by Paralee Cancel M.D. on 07/16/2011 09:15:38 AM

## 2011-12-18 NOTE — Interval H&P Note (Signed)
History and Physical Interval Note:  12/18/2011 1:13 PM  [name]  has presented today for surgery, with the diagnosis of Osteoarthritis of Left Hip  The various methods of treatment have been discussed with the patient and family. After consideration of risks, benefits and other options for treatment, the patient has consented to  Procedure(s) (LRB):LEFT TOTAL HIP ARTHROPLASTY ANTERIOR APPROACH (Left) as a surgical intervention .  The patients' history has been reviewed, patient examined, no change in status, stable for surgery.  I have reviewed the patients' chart and labs.  Questions were answered to the patient's satisfaction.     Shelda Pal

## 2011-12-18 NOTE — Preoperative (Signed)
Beta Blockers   Reason not to administer Beta Blockers:Not Applicable 

## 2011-12-18 NOTE — Anesthesia Postprocedure Evaluation (Signed)
  Anesthesia Post-op Note  Patient: [name]  Procedure(s) Performed: Procedure(s) (LRB): TOTAL HIP ARTHROPLASTY ANTERIOR APPROACH (Left)  Patient Location: PACU  Anesthesia Type: General  Level of Consciousness: awake and alert   Airway and Oxygen Therapy: Patient Spontanous Breathing  Post-op Pain: mild  Post-op Assessment: Post-op Vital signs reviewed,, Respiratory Function Stable, Patent Airway and No signs of Nausea or vomiting  Post-op Vital Signs: stable  Complications: No apparent anesthesia complications/awaiting cardiac eval

## 2011-12-18 NOTE — Progress Notes (Signed)
hemovac drain in place upon arrival to pacu Operating room nurse to enter drain into lda's

## 2011-12-19 LAB — CBC
HCT: 39.4 % (ref 39.0–52.0)
Hemoglobin: 13.3 g/dL (ref 13.0–17.0)
MCHC: 33.8 g/dL (ref 30.0–36.0)
MCV: 91.8 fL (ref 78.0–100.0)
RDW: 12.6 % (ref 11.5–15.5)
WBC: 17.4 10*3/uL — ABNORMAL HIGH (ref 4.0–10.5)

## 2011-12-19 LAB — BASIC METABOLIC PANEL
BUN: 8 mg/dL (ref 6–23)
Chloride: 101 mEq/L (ref 96–112)
Creatinine, Ser: 0.62 mg/dL (ref 0.50–1.35)
GFR calc Af Amer: >90 mL/min (ref >90)
Glucose, Bld: 136 mg/dL — ABNORMAL HIGH (ref 70–99)
Potassium: 3.8 mEq/L (ref 3.5–5.1)

## 2011-12-19 MED ORDER — POLYETHYLENE GLYCOL 3350 17 G PO PACK: 17.0000 g | PACK | Freq: Two times a day (BID) | ORAL | Status: AC

## 2011-12-19 MED ORDER — DIPHENHYDRAMINE HCL 25 MG PO CAPS: 25.0000 mg | ORAL_CAPSULE | Freq: Four times a day (QID) | ORAL | Status: DC | PRN

## 2011-12-19 MED ORDER — ASPIRIN EC 325 MG PO TBEC: 325.0000 mg | DELAYED_RELEASE_TABLET | Freq: Two times a day (BID) | ORAL | Status: AC

## 2011-12-19 MED ORDER — FERROUS SULFATE 325 (65 FE) MG PO TABS: 325.0000 mg | ORAL_TABLET | Freq: Three times a day (TID) | ORAL | Status: DC

## 2011-12-19 MED ORDER — METHOCARBAMOL 500 MG PO TABS: 500.0000 mg | ORAL_TABLET | Freq: Four times a day (QID) | ORAL | Status: AC | PRN

## 2011-12-19 MED ORDER — DSS 100 MG PO CAPS: 100.0000 mg | ORAL_CAPSULE | Freq: Two times a day (BID) | ORAL | Status: AC

## 2011-12-19 MED ORDER — HYDROCODONE-ACETAMINOPHEN 7.5-325 MG PO TABS: 1.0000 | ORAL_TABLET | ORAL | Status: AC | PRN

## 2011-12-19 NOTE — Evaluation (Signed)
Physical Therapy Evaluation Patient Details Name: [name] MRN: [id number] DOB: 03/03/1946 Today's Date: 12/19/2011  Problem List:  Patient Active Problem List  Diagnoses  . S/P left THA, AA    Past Medical History:  Past Medical History  Diagnosis Date  . Hypertension   . Spastic esophagus     HX OF CHEST PAIN--STATES WENT TO HOSPTIAL YRS AGO--TOLD HEART OK--PAIN ATTRIBUTED TO SPASMS OF ESOPHAGUS  . Shortness of breath     JUST WITH EXERTION  . Arthritis     PAIN AND OA LEFT HIP--SOME PROBLEMS WITH RT HIP BUT NOTHING LIKE THE LEFT HIP  . Rash, skin      RASH ON LEGS--ITCHING -EVERY WINTER.   Past Surgical History:  Past Surgical History  Procedure Date  . Hernia repair     LAPAROSCOPIC INGUINAL HERNIA REPAIR WITH MESH- RIGHT  . Dupyentrens contracture repair     RIGHT HAND REPAIR X 2 AND LEFT HAND X 1    PT Assessment/Plan/Recommendation PT Assessment Clinical Impression Statement: Pt with L THR presents with decreased L LE strength/Rom and decreased funtional mobility. PT Recommendation/Assessment: Patient will need skilled PT in the acute care venue PT Problem List: Decreased strength;Decreased range of motion;Decreased activity tolerance;Decreased mobility;Decreased knowledge of use of DME;Pain PT Therapy Diagnosis : Difficulty walking PT Plan PT Frequency: 7X/week PT Treatment/Interventions: DME instruction;Gait training;Stair training;Functional mobility training;Therapeutic activities;Therapeutic exercise;Patient/family education PT Recommendation Follow Up Recommendations: Home health PT Equipment Recommended: None recommended by PT PT Goals  Acute Rehab PT Goals PT Goal Formulation: With patient Time For Goal Achievement: 7 days Pt will go Supine/Side to Sit: with supervision PT Goal: Supine/Side to Sit - Progress: Goal set today Pt will go Sit to Supine/Side: with supervision PT Goal: Sit to Supine/Side - Progress: Goal set today Pt will go Sit to  Stand: with supervision PT Goal: Sit to Stand - Progress: Goal set today Pt will go Stand to Sit: with supervision PT Goal: Stand to Sit - Progress: Goal set today Pt will Ambulate: >150 feet;with supervision;with rolling walker PT Goal: Ambulate - Progress: Goal set today Pt will Go Up / Down Stairs: Flight;with min assist;with least restrictive assistive device PT Goal: Up/Down Stairs - Progress: Goal set today  PT Evaluation Precautions/Restrictions  Restrictions Weight Bearing Restrictions: No Prior Functioning  Home Living Lives With: [name] Help From:  (staying with sister and brother in law.) Type of Home: House Home Layout: Multi-level;Able to live on main level with bedroom/bathroom Home Access: Ramped entrance Entrance Stairs-Rails: Left Entrance Stairs-Number of Steps: 12 Bathroom Shower/Tub: Health visitor: Standard Bathroom Accessibility: Yes How Accessible: Accessible via walker Home Adaptive Equipment: Dan Humphreys - four wheeled Prior Function Level of Independence: Independent with basic ADLs;Independent with transfers;Independent with homemaking with ambulation;Independent with gait Able to Take Stairs?: Yes Driving: Yes Vocation: Full time employment Cognition Cognition Arousal/Alertness: Awake/alert Overall Cognitive Status: Appears within functional limits for tasks assessed Orientation Level: Oriented X4 Sensation/Coordination Coordination Gross Motor Movements are Fluid and Coordinated: Yes Extremity Assessment RUE Assessment RUE Assessment: Within Functional Limits LUE Assessment LUE Assessment: Within Functional Limits RLE Assessment RLE Assessment: Within Functional Limits LLE Assessment LLE Assessment: Exceptions to [facility] (3/5 hip stength; ROM testing deferred 2* bleeding drain site) Mobility (including Balance) Bed Mobility Bed Mobility: No Supine to Sit: 5: Supervision Sit to Supine: 5: Supervision Transfers Transfers:  Yes Sit to Stand: 5: Supervision;With upper extremity assist;From chair/3-in-1;From toilet;With armrests Sit to Stand Details (indicate cue type and reason):  cues for hand placement and LE position Stand to Sit: 5: Supervision;With upper extremity assist;With armrests;To chair/3-in-1;To toilet Stand to Sit Details: cues for hand placement and LE position Ambulation/Gait Ambulation/Gait: Yes Ambulation/Gait Assistance: 4: Min assist;5: Supervision Ambulation/Gait Assistance Details (indicate cue type and reason): CGA with cues for posture and position from RW Ambulation Distance (Feet): 250 Feet Assistive device: Rolling walker Gait Pattern: Step-to pattern;Step-through pattern    Exercise    End of Session PT - End of Session Activity Tolerance: Patient tolerated treatment well Patient left: in chair;with call bell in reach Nurse Communication: Mobility status for transfers;Mobility status for ambulation General Behavior During Session: [facility] for tasks performed Cognition: [facility] for tasks performed  [name] 12/19/2011, 12:08 PM

## 2011-12-19 NOTE — Progress Notes (Signed)
CARE MANAGEMENT NOTE 12/19/2011  Patient:  [facility]   Account Number:  [id number]  Date Initiated:  12/19/2011  Documentation initiated by:  Colleen Can  Subjective/Objective Assessment:   dx osteoarthritis left hip; Total hip replacemnt-anterior approach     Action/Plan:   CM spoke with patient about discharge plans. Plans are for pt to return to  [place], [place] where he will stay wth sister-in-law . He is requesting [facility] services when offered choice. Pre-arranged prios to admit   Anticipated DC Date:  12/19/2011   Anticipated DC Plan:  HOME W HOME HEALTH SERVICES  In-house referral  NA      DC Planning Services  CM consult      [facility] Choice  HOME HEALTH   Choice offered to / List presented to:  C-1 Patient   DME arranged  NA      DME agency  NA     HH arranged  HH-2 PT      [facility] agency  [facility]   Status of service:  Completed, signed off Medicare Important Message given?  NO (If response is "NO", the following Medicare IM given date fields will be blank) Date Medicare IM given:   Date Additional Medicare IM given:    Discharge Disposition:  HOME W HOME HEALTH SERVICES  Comments:  12/19/2011 Raynelle Bring BSN CCM [phone] Pt already has wheelchair, RW, and cane. List of HH agencies placed in shadow chart. Genevieve Norlander notified that patient will not be going back to his residence but will recuperate at relatives home. Services for [facility] will start 12/20/2011

## 2011-12-19 NOTE — Evaluation (Signed)
Occupational Therapy Evaluation Patient Details Name: [name] MRN: [id number] DOB: 08-06-1946 Today's Date: 12/19/2011  Problem List:  Patient Active Problem List  Diagnoses  . S/P left THA, AA    Past Medical History:  Past Medical History  Diagnosis Date  . Hypertension   . Spastic esophagus     HX OF CHEST PAIN--STATES WENT TO HOSPTIAL YRS AGO--TOLD HEART OK--PAIN ATTRIBUTED TO SPASMS OF ESOPHAGUS  . Shortness of breath     JUST WITH EXERTION  . Arthritis     PAIN AND OA LEFT HIP--SOME PROBLEMS WITH RT HIP BUT NOTHING LIKE THE LEFT HIP  . Rash, skin      RASH ON LEGS--ITCHING -EVERY WINTER.   Past Surgical History:  Past Surgical History  Procedure Date  . Hernia repair     LAPAROSCOPIC INGUINAL HERNIA REPAIR WITH MESH- RIGHT  . Dupyentrens contracture repair     RIGHT HAND REPAIR X 2 AND LEFT HAND X 1    OT Assessment/Plan/Recommendation OT Assessment Clinical Impression Statement: Pt doing extremely well POD 1 LTHA. All education completed. Pt will have necessary level of A from family upon d/c. OT Recommendation/Assessment: Patient does not need any further OT services OT Recommendation Follow Up Recommendations: No OT follow up Equipment Recommended: None recommended by OT OT Goals    OT Evaluation Precautions/Restrictions  Restrictions Weight Bearing Restrictions: No Prior Functioning Home Living Lives With: [name] Help From:  (staying with sister and brother in law.) Type of Home: House Home Layout: Multi-level;Able to live on main level with bedroom/bathroom Home Access: Ramped entrance Entrance Stairs-Rails: Left Entrance Stairs-Number of Steps: 12 Bathroom Shower/Tub: Health visitor: Standard Bathroom Accessibility: Yes How Accessible: Accessible via walker Home Adaptive Equipment: Dan Humphreys - four wheeled Prior Function Level of Independence: Independent with basic ADLs;Independent with transfers;Independent with  homemaking with ambulation;Independent with gait Able to Take Stairs?: Yes Driving: Yes Vocation: Full time employment ADL ADL Grooming: Simulated;Supervision/safety Where Assessed - Grooming: Standing at sink Upper Body Bathing: Simulated;Supervision/safety Where Assessed - Upper Body Bathing: Standing at sink Lower Body Bathing: Simulated;Set up Where Assessed - Lower Body Bathing: Sit to stand from chair Upper Body Dressing: Simulated;Supervision/safety Where Assessed - Upper Body Dressing: Standing Lower Body Dressing: Simulated;Set up Where Assessed - Lower Body Dressing: Sit to stand from chair Toilet Transfer: Performed;Supervision/safety Toilet Transfer Method: Proofreader: Regular height toilet Toileting - Clothing Manipulation: Simulated;Supervision/safety Where Assessed - Toileting Clothing Manipulation: Sit to stand from 3-in-1 or toilet Toileting - Hygiene: Simulated;Supervision/safety Where Assessed - Toileting Hygiene: Sit to stand from 3-in-1 or toilet Tub/Shower Transfer: Performed;Supervision/safety Tub/Shower Transfer Method: Ambulating Vision/Perception    Cognition Cognition Arousal/Alertness: Awake/alert Overall Cognitive Status: Appears within functional limits for tasks assessed Orientation Level: Oriented X4 Sensation/Coordination Coordination Gross Motor Movements are Fluid and Coordinated: Yes Extremity Assessment RUE Assessment RUE Assessment: Within Functional Limits LUE Assessment LUE Assessment: Within Functional Limits Mobility  Bed Mobility Bed Mobility: No Supine to Sit: 5: Supervision Sit to Supine: 5: Supervision Transfers Transfers: Yes Sit to Stand: 5: Supervision;With upper extremity assist;From chair/3-in-1;From toilet;With armrests Sit to Stand Details (indicate cue type and reason): cues for hand placement and LE position Stand to Sit: 5: Supervision;With upper extremity assist;With armrests;To  chair/3-in-1;To toilet Stand to Sit Details: cues for hand placement and LE position Exercises   End of Session OT - End of Session Activity Tolerance: Patient tolerated treatment well Patient left: in chair;with call bell in reach General Behavior During Session:  [facility] for tasks performed Cognition: [facility] for tasks performed   Can Lucci A OTR/L [phone] 12/19/2011, 12:09 PM

## 2011-12-19 NOTE — Progress Notes (Signed)
Subjective: 1 Day Post-Op Procedure(s) (LRB): TOTAL HIP ARTHROPLASTY ANTERIOR APPROACH (Left)   Patient reports pain as mild. Well controlled with medication. No events throughout the night.  Objective:   VITALS:   Filed Vitals:   12/19/11 0550  BP: 145/82  Pulse: 66  Temp: 98.1 F (36.7 C)  Resp: 16    Neurovascular intact Dorsiflexion/Plantar flexion intact Incision: dressing C/D/I No cellulitis present Compartment soft  LABS  Basename 12/19/11 0432  HGB 13.3  HCT 39.4  WBC 17.4*  PLT 185     Basename 12/19/11 0432  NA 134*  K 3.8  BUN 8  CREATININE 0.62  GLUCOSE 136*     Assessment/Plan: 1 Day Post-Op Procedure(s) (LRB): TOTAL HIP ARTHROPLASTY ANTERIOR APPROACH (Left)   Foley cath d/c'ed HV drain d/c'ed Advance diet Up with therapy D/C IV fluids Discharge home with home health if he does well with PT Follow up in 2 weeks at [facility].  Follow-up Information    Follow up with OLIN,Ellana Kawa D in 2 weeks.   Contact information:   [facility] [address], [address] [place] [address] [phone]          Anastasio Auerbach. Schelly Chuba   PAC  12/19/2011, 8:40 AM

## 2011-12-19 NOTE — Progress Notes (Signed)
Physical Therapy Treatment Patient Details Name: [name] MRN: [id number] DOB: [birth year] Today's Date: 12/19/2011  PT Assessment/Plan  PT - Assessment/Plan Comments on Treatment Session: Pt feels he is ready for d/c.`` PT Plan: Discharge plan remains appropriate PT Frequency: 7X/week Follow Up Recommendations: Home health PT PT Goals  Acute Rehab PT Goals PT Goal Formulation: With patient Time For Goal Achievement: 7 days Pt will go Supine/Side to Sit: with supervision PT Goal: Supine/Side to Sit - Progress: Met Pt will go Sit to Supine/Side: with supervision PT Goal: Sit to Supine/Side - Progress: Met Pt will go Sit to Stand: with supervision PT Goal: Sit to Stand - Progress: Met Pt will go Stand to Sit: with supervision PT Goal: Stand to Sit - Progress: Met Pt will Ambulate: >150 feet;with supervision;with rolling walker PT Goal: Ambulate - Progress: Met Pt will Go Up / Down Stairs: Flight;with min assist;with least restrictive assistive device PT Goal: Up/Down Stairs - Progress: Progressing toward goal  PT Treatment Precautions/Restrictions  Restrictions Weight Bearing Restrictions: No Mobility (including Balance) Transfers Sit to Stand: 5: Supervision;With upper extremity assist;From chair/3-in-1;From toilet;With armrests Stand to Sit: 5: Supervision;With upper extremity assist;With armrests;To chair/3-in-1;To toilet Stand to Sit Details: cues for use of UEs Ambulation/Gait Ambulation/Gait Assistance: 5: Supervision Ambulation/Gait Assistance Details (indicate cue type and reason): min cues for position from RW Ambulation Distance (Feet): 500 Feet Assistive device: 4-wheeled walker Gait Pattern: Step-through pattern Stairs: Yes Stairs Assistance: 4: Min assist Stairs Assistance Details (indicate cue type and reason): cues for sequence and for foot/crutch placement Stair Management Technique: One rail Left;With crutches;Forwards;Step to pattern Number of  Stairs: 4     Exercise  Total Joint Exercises Ankle Circles/Pumps: AROM;20 reps;Supine;Both Quad Sets: 15 reps;Supine;Both;AROM Gluteal Sets: 10 reps;AROM;Both;Supine Heel Slides: AAROM;15 reps;Supine;Left Hip ABduction/ADduction: AAROM;Left;15 reps;Supine End of Session PT - End of Session Equipment Utilized During Treatment: Gait belt Activity Tolerance: Patient tolerated treatment well Patient left: in chair;with call bell in reach Nurse Communication: Mobility status for transfers;Mobility status for ambulation General Behavior During Session: [facility] for tasks performed Cognition: [facility] for tasks performed  Emerick Weatherly 12/19/2011, 4:23 PM

## 2011-12-22 NOTE — Discharge Summary (Signed)
Physician Discharge Summary  Patient ID: [name] MRN: [id number] DOB/AGE: 1946/05/26 66 y.o.  Admit date: 12/18/2011 Discharge date: 12/19/2011  Procedures:  Procedure(s) (LRB): TOTAL HIP ARTHROPLASTY ANTERIOR APPROACH (Left)  Attending Physician:  Dr. Durene Romans   Admission Diagnoses: End-stage osteoarthritis, left hip   Discharge Diagnoses:  Principal Problem:  *S/P left THA, AA HTN Hyperlipidemia History of shingles   HPI: This is a 66 year old gentleman with a history of osteoarthritis of his left hip that failed conservative management. After discussion of treatments, benefits, risks and options, the patient is now scheduled for a total hip arthroplasty of the left hip. Note that his primary care doctor is Dr. Dina Rich. He is a candidate for tranexamic acid and dexamethasone, will receive that at surgery. He plans on going home after surgery. He is given his home medications of aspirin, iron MiraLAX, Robaxin and Colace.  PCP: Dina Rich, MD, MD   Discharged Condition: good  Hospital Course:  Patient underwent the above stated procedure on 12/18/2011. Patient tolerated the procedure well and brought to the recovery room in good condition and subsequently to the floor.  POD #1 BP: 145/82 ; Pulse: 66 ; Temp: 98.1 F (36.7 C) ; Resp: 16  Pt's foley was removed, as well as the hemovac drain removed. IV was changed to a saline lock. Patient reports pain as mild. Well controlled with medication. No events throughout the night. Feels ready to be discharged home. Neurovascular intact, dorsiflexion/plantar flexion intact, incision: dressing C/D/I, no cellulitis present and compartment soft.   LABS  Basename  12/19/11 0432  HGB  13.3  HCT  39.4    Discharge Exam: General appearance: alert, cooperative and no distress Extremities: Homans sign is negative, no sign of DVT, no edema, redness or tenderness in the calves or thighs and no ulcers, gangrene or trophic  changes  Disposition: Home with follow up in 2 weeks  Follow-up Information    Follow up with OLIN,Shamecka Hocutt D in 2 weeks.   Contact information:   [facility] [address], [address] [place] [address] [phone]          Discharge Orders    Future Orders Please Complete By Expires   Diet - low sodium heart healthy      Call MD / Call 911      Comments:   If you experience chest pain or shortness of breath, CALL 911 and be transported to the hospital emergency room.  If you develope a fever above 101 F, pus (white drainage) or increased drainage or redness at the wound, or calf pain, call your surgeon's office.   Discharge instructions      Comments:   Maintain surgical dressing for 8 days, then replace with gauze and tape. Keep the area dry and clean until follow up. Follow up in 2 weeks at [facility]. Call with any questions or concerns.     Constipation Prevention      Comments:   Drink plenty of fluids.  Prune juice may be helpful.  You may use a stool softener, such as Colace (over the counter) 100 mg twice a day.  Use MiraLax (over the counter) for constipation as needed.   Increase activity slowly as tolerated      Weight Bearing as taught in Physical Therapy      Comments:   Use a walker or crutches as instructed.   Driving restrictions      Comments:   No driving for  4 weeks   Change dressing      Comments:   Maintain surgical dressing for 8 days, then replace with 4x4 guaze and tape. Keep the area dry and clean.   TED hose      Comments:   Use stockings (TED hose) for 2 weeks on both leg(s).  You may remove them at night for sleeping.      Discharge Medication List as of 12/19/2011  2:14 PM    START taking these medications   Details  aspirin EC 325 MG tablet Take 1 tablet (325 mg total) by mouth 2 (two) times daily. X 4 weeks, Starting 12/19/2011, Until Sat 12/29/11, No Print    diphenhydrAMINE (BENADRYL) 25  mg capsule Take 1 capsule (25 mg total) by mouth every 6 (six) hours as needed for itching, allergies or sleep., Starting 12/19/2011, Until Sat 12/29/11, No Print    docusate sodium 100 MG CAPS Take 100 mg by mouth 2 (two) times daily., Starting 12/19/2011, Until Sat 12/29/11, No Print    ferrous sulfate 325 (65 FE) MG tablet Take 1 tablet (325 mg total) by mouth 3 (three) times daily after meals., Starting 12/19/2011, Until Thu 12/18/12, No Print    HYDROcodone-acetaminophen (NORCO) 7.5-325 MG per tablet Take 1-2 tablets by mouth every 4 (four) hours as needed for pain., Starting 12/19/2011, Until Sat 12/29/11, Print    methocarbamol (ROBAXIN) 500 MG tablet Take 1 tablet (500 mg total) by mouth every 6 (six) hours as needed (muscle spasms)., Starting 12/19/2011, Until Sat 12/29/11, No Print    polyethylene glycol (MIRALAX / GLYCOLAX) packet Take 17 g by mouth 2 (two) times daily., Starting 12/19/2011, Until Sat 12/22/11, No Print      CONTINUE these medications which have NOT CHANGED   Details  Amlodipine-Valsartan-HCTZ (EXFORGE HCT) 10-320-25 MG TABS Take by mouth. ONE EVERY AM, Until Discontinued, Historical Med    atorvastatin (LIPITOR) 10 MG tablet Take 10 mg by mouth every morning., Until Discontinued, Historical Med    glucosamine-chondroitin 500-400 MG tablet Take 1 tablet by mouth 2 (two) times daily., Until Discontinued, Historical Med    Ascorbic Acid (VITAMIN C) 1000 MG tablet Take 1,000 mg by mouth every morning., Until Discontinued, Historical Med    hydrocortisone cream 1 % Apply 1 application topically 2 (two) times daily as needed. For itching, Until Discontinued, Historical Med    Multiple Vitamin (MULITIVITAMIN WITH MINERALS) TABS Take 1 tablet by mouth every morning., Until Discontinued, Historical Med    tetrahydrozoline (VISINE) 0.05 % ophthalmic solution Place 1 drop into both eyes 2 (two) times daily as needed. For dry irritated eyes, Until Discontinued, Historical Med      STOP  taking these medications     HYDROcodone-acetaminophen (VICODIN) 5-500 MG per tablet Comments:  Reason for Stopping:       aspirin 81 MG tablet Comments:  Reason for Stopping:       naproxen sodium (ANAPROX) 220 MG tablet Comments:  Reason for Stopping:            Signed: Anastasio Auerbach. Cherolyn Behrle   PAC  12/22/2011, 3:27 PM

## 2011-12-27 ENCOUNTER — Encounter (HOSPITAL_COMMUNITY): Payer: Self-pay | Admitting: Orthopedic Surgery

## 2013-10-12 IMAGING — CR DG PORTABLE PELVIS
1 series · 1 of 1 positions shown · non-contrast
Comparison: None.

CLINICAL DATA: Postop left total hip replacement.

PORTABLE PELVIS

[AP]
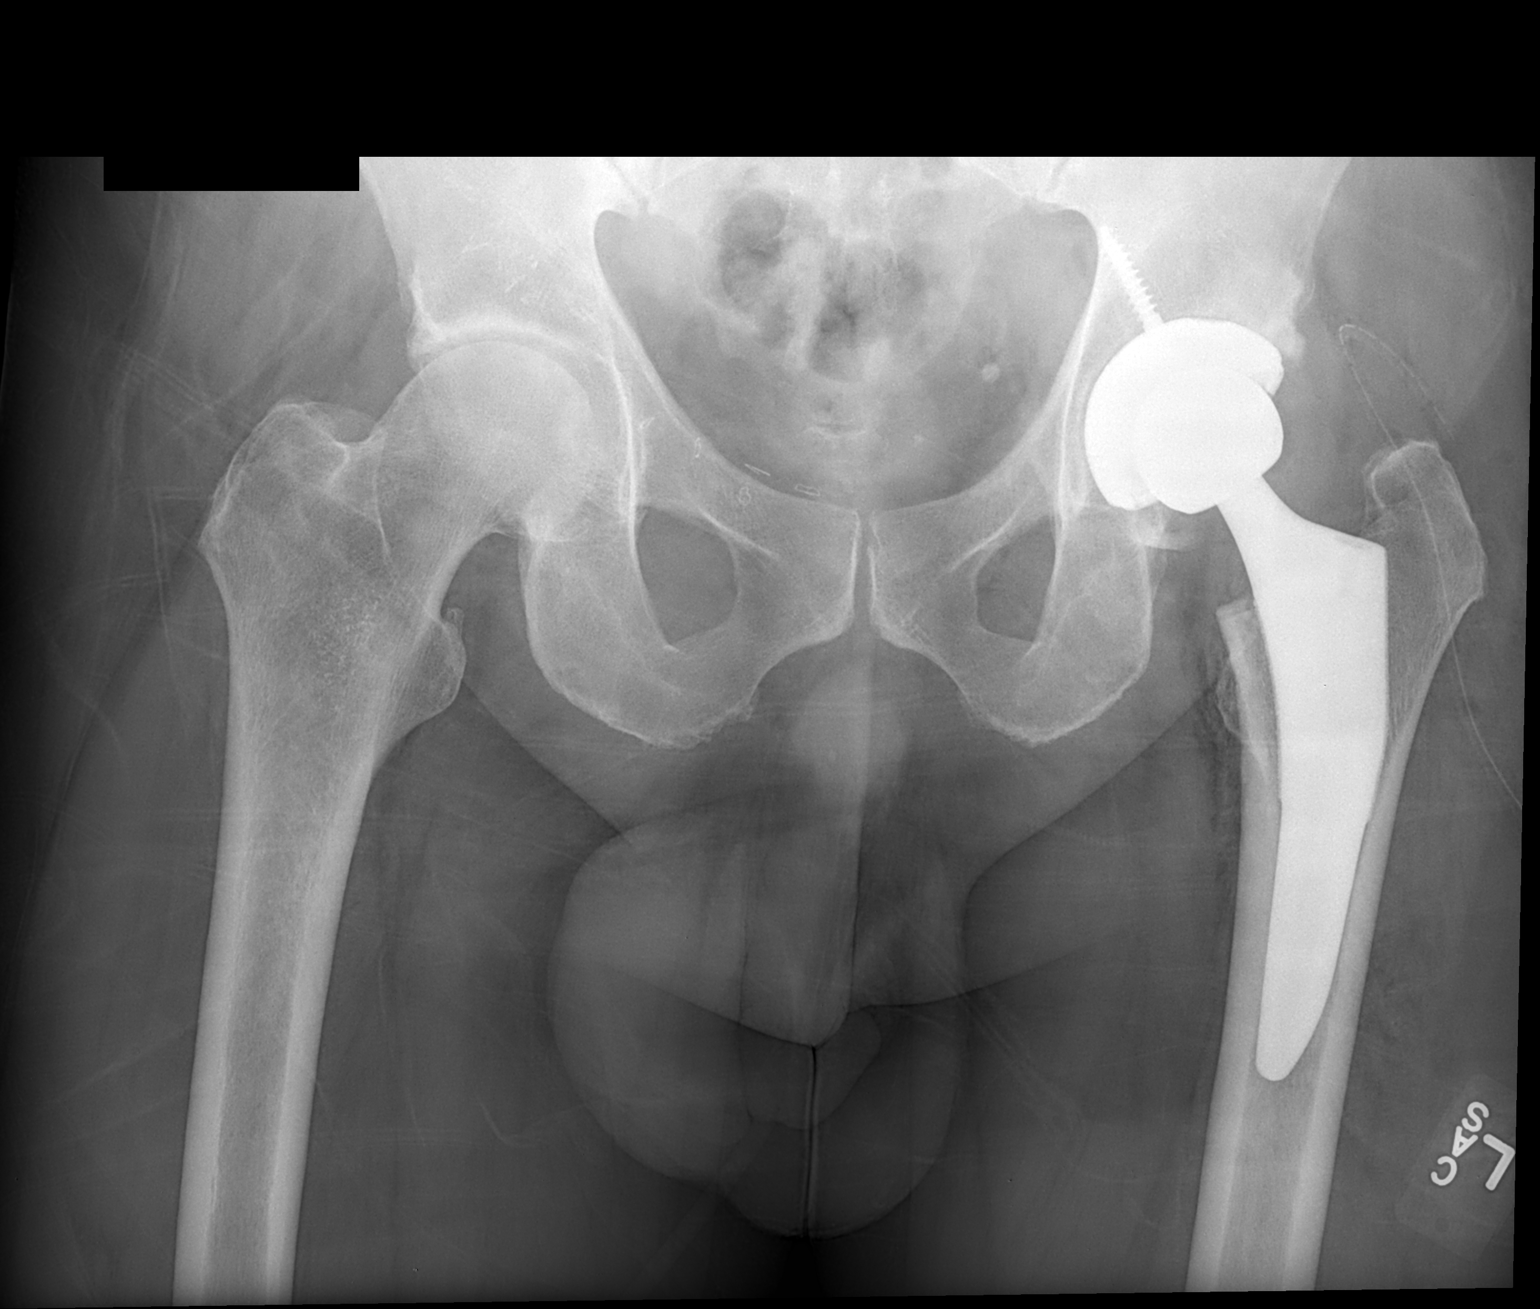

[1 of 1 positions shown; findings below may reference images not displayed]

FINDINGS: Changes of left hip replacement.  Soft tissue drain in
place.  No hardware or bony complicating feature.  Normal
alignment.  Moderate degenerative changes in the right hip.
IMPRESSION: Left hip replacement.  No complicating feature.

## 2015-03-16 ENCOUNTER — Other Ambulatory Visit: Payer: Self-pay | Admitting: Rehabilitation

## 2015-03-16 DIAGNOSIS — M5412 Radiculopathy, cervical region: Secondary | ICD-10-CM

## 2015-04-03 ENCOUNTER — Ambulatory Visit
Admission: RE | Admit: 2015-04-03 | Discharge: 2015-04-03 | Disposition: A | Discharge status: Home / Self Care | Payer: 59 | Source: Ambulatory Visit | Attending: Rehabilitation | Admitting: Rehabilitation

## 2015-04-03 DIAGNOSIS — M5412 Radiculopathy, cervical region: Secondary | ICD-10-CM

## 2018-01-27 DIAGNOSIS — M4726 Other spondylosis with radiculopathy, lumbar region: Secondary | ICD-10-CM | POA: Diagnosis not present

## 2018-01-27 DIAGNOSIS — M545 Low back pain: Secondary | ICD-10-CM | POA: Diagnosis not present

## 2018-01-27 DIAGNOSIS — G894 Chronic pain syndrome: Secondary | ICD-10-CM | POA: Diagnosis not present

## 2018-02-03 DIAGNOSIS — E782 Mixed hyperlipidemia: Secondary | ICD-10-CM | POA: Diagnosis not present

## 2018-02-24 DIAGNOSIS — Z6829 Body mass index (BMI) 29.0-29.9, adult: Secondary | ICD-10-CM | POA: Diagnosis not present

## 2018-02-24 DIAGNOSIS — G894 Chronic pain syndrome: Secondary | ICD-10-CM | POA: Diagnosis not present

## 2018-02-24 DIAGNOSIS — M4726 Other spondylosis with radiculopathy, lumbar region: Secondary | ICD-10-CM | POA: Diagnosis not present

## 2018-02-24 DIAGNOSIS — M545 Low back pain: Secondary | ICD-10-CM | POA: Diagnosis not present

## 2018-04-02 DIAGNOSIS — I10 Essential (primary) hypertension: Secondary | ICD-10-CM | POA: Diagnosis not present

## 2018-04-02 DIAGNOSIS — G894 Chronic pain syndrome: Secondary | ICD-10-CM | POA: Diagnosis not present

## 2018-04-02 DIAGNOSIS — Z6829 Body mass index (BMI) 29.0-29.9, adult: Secondary | ICD-10-CM | POA: Diagnosis not present

## 2018-04-02 DIAGNOSIS — M4726 Other spondylosis with radiculopathy, lumbar region: Secondary | ICD-10-CM | POA: Diagnosis not present

## 2018-04-30 DIAGNOSIS — M4726 Other spondylosis with radiculopathy, lumbar region: Secondary | ICD-10-CM | POA: Diagnosis not present

## 2018-04-30 DIAGNOSIS — G894 Chronic pain syndrome: Secondary | ICD-10-CM | POA: Diagnosis not present

## 2018-05-28 DIAGNOSIS — G894 Chronic pain syndrome: Secondary | ICD-10-CM | POA: Diagnosis not present

## 2018-05-28 DIAGNOSIS — Z79891 Long term (current) use of opiate analgesic: Secondary | ICD-10-CM | POA: Diagnosis not present

## 2018-05-28 DIAGNOSIS — Z79899 Other long term (current) drug therapy: Secondary | ICD-10-CM | POA: Diagnosis not present

## 2018-05-28 DIAGNOSIS — M4726 Other spondylosis with radiculopathy, lumbar region: Secondary | ICD-10-CM | POA: Diagnosis not present

## 2018-06-25 DIAGNOSIS — M4726 Other spondylosis with radiculopathy, lumbar region: Secondary | ICD-10-CM | POA: Diagnosis not present

## 2018-06-25 DIAGNOSIS — G894 Chronic pain syndrome: Secondary | ICD-10-CM | POA: Diagnosis not present

## 2018-06-27 DIAGNOSIS — G4700 Insomnia, unspecified: Secondary | ICD-10-CM | POA: Diagnosis not present

## 2018-06-27 DIAGNOSIS — Z79899 Other long term (current) drug therapy: Secondary | ICD-10-CM | POA: Diagnosis not present

## 2018-06-27 DIAGNOSIS — M1990 Unspecified osteoarthritis, unspecified site: Secondary | ICD-10-CM | POA: Diagnosis not present

## 2018-06-27 DIAGNOSIS — R748 Abnormal levels of other serum enzymes: Secondary | ICD-10-CM | POA: Diagnosis not present

## 2018-06-27 DIAGNOSIS — I10 Essential (primary) hypertension: Secondary | ICD-10-CM | POA: Diagnosis not present

## 2018-06-27 DIAGNOSIS — E782 Mixed hyperlipidemia: Secondary | ICD-10-CM | POA: Diagnosis not present

## 2018-06-27 DIAGNOSIS — R7303 Prediabetes: Secondary | ICD-10-CM | POA: Diagnosis not present

## 2018-07-17 DIAGNOSIS — Z0000 Encounter for general adult medical examination without abnormal findings: Secondary | ICD-10-CM | POA: Diagnosis not present

## 2018-07-17 DIAGNOSIS — Z23 Encounter for immunization: Secondary | ICD-10-CM | POA: Diagnosis not present

## 2018-07-17 DIAGNOSIS — M1711 Unilateral primary osteoarthritis, right knee: Secondary | ICD-10-CM | POA: Diagnosis not present

## 2018-07-25 DIAGNOSIS — G894 Chronic pain syndrome: Secondary | ICD-10-CM | POA: Diagnosis not present

## 2018-07-25 DIAGNOSIS — M4726 Other spondylosis with radiculopathy, lumbar region: Secondary | ICD-10-CM | POA: Diagnosis not present

## 2018-08-29 DIAGNOSIS — I10 Essential (primary) hypertension: Secondary | ICD-10-CM | POA: Diagnosis not present

## 2018-08-29 DIAGNOSIS — G894 Chronic pain syndrome: Secondary | ICD-10-CM | POA: Diagnosis not present

## 2018-08-29 DIAGNOSIS — M4726 Other spondylosis with radiculopathy, lumbar region: Secondary | ICD-10-CM | POA: Diagnosis not present

## 2018-08-29 DIAGNOSIS — Z6829 Body mass index (BMI) 29.0-29.9, adult: Secondary | ICD-10-CM | POA: Diagnosis not present

## 2018-09-26 DIAGNOSIS — G894 Chronic pain syndrome: Secondary | ICD-10-CM | POA: Diagnosis not present

## 2018-09-26 DIAGNOSIS — M4726 Other spondylosis with radiculopathy, lumbar region: Secondary | ICD-10-CM | POA: Diagnosis not present

## 2018-10-30 DIAGNOSIS — G894 Chronic pain syndrome: Secondary | ICD-10-CM | POA: Diagnosis not present

## 2018-10-30 DIAGNOSIS — M4726 Other spondylosis with radiculopathy, lumbar region: Secondary | ICD-10-CM | POA: Diagnosis not present

## 2018-11-25 DIAGNOSIS — M4726 Other spondylosis with radiculopathy, lumbar region: Secondary | ICD-10-CM | POA: Diagnosis not present

## 2018-11-25 DIAGNOSIS — Z79891 Long term (current) use of opiate analgesic: Secondary | ICD-10-CM | POA: Diagnosis not present

## 2018-11-25 DIAGNOSIS — G894 Chronic pain syndrome: Secondary | ICD-10-CM | POA: Diagnosis not present

## 2018-11-25 DIAGNOSIS — Z79899 Other long term (current) drug therapy: Secondary | ICD-10-CM | POA: Diagnosis not present

## 2019-01-27 DIAGNOSIS — Z1211 Encounter for screening for malignant neoplasm of colon: Secondary | ICD-10-CM | POA: Diagnosis not present

## 2019-01-27 DIAGNOSIS — R7303 Prediabetes: Secondary | ICD-10-CM | POA: Diagnosis not present

## 2019-01-27 DIAGNOSIS — R7989 Other specified abnormal findings of blood chemistry: Secondary | ICD-10-CM | POA: Diagnosis not present

## 2019-01-27 DIAGNOSIS — Z0000 Encounter for general adult medical examination without abnormal findings: Secondary | ICD-10-CM | POA: Diagnosis not present

## 2019-01-27 DIAGNOSIS — Z125 Encounter for screening for malignant neoplasm of prostate: Secondary | ICD-10-CM | POA: Diagnosis not present

## 2019-01-27 DIAGNOSIS — I10 Essential (primary) hypertension: Secondary | ICD-10-CM | POA: Diagnosis not present

## 2019-01-27 DIAGNOSIS — F410 Panic disorder [episodic paroxysmal anxiety] without agoraphobia: Secondary | ICD-10-CM | POA: Diagnosis not present

## 2019-01-27 DIAGNOSIS — Z131 Encounter for screening for diabetes mellitus: Secondary | ICD-10-CM | POA: Diagnosis not present

## 2019-01-27 DIAGNOSIS — E782 Mixed hyperlipidemia: Secondary | ICD-10-CM | POA: Diagnosis not present

## 2019-01-27 DIAGNOSIS — G4700 Insomnia, unspecified: Secondary | ICD-10-CM | POA: Diagnosis not present

## 2019-01-28 DIAGNOSIS — G894 Chronic pain syndrome: Secondary | ICD-10-CM | POA: Diagnosis not present

## 2019-01-28 DIAGNOSIS — M4726 Other spondylosis with radiculopathy, lumbar region: Secondary | ICD-10-CM | POA: Diagnosis not present

## 2019-02-02 DIAGNOSIS — N179 Acute kidney failure, unspecified: Secondary | ICD-10-CM | POA: Diagnosis not present

## 2019-02-02 DIAGNOSIS — R7989 Other specified abnormal findings of blood chemistry: Secondary | ICD-10-CM | POA: Diagnosis not present

## 2019-02-06 DIAGNOSIS — K831 Obstruction of bile duct: Secondary | ICD-10-CM | POA: Diagnosis not present

## 2019-02-06 DIAGNOSIS — N179 Acute kidney failure, unspecified: Secondary | ICD-10-CM | POA: Diagnosis not present

## 2019-02-06 DIAGNOSIS — J90 Pleural effusion, not elsewhere classified: Secondary | ICD-10-CM | POA: Diagnosis not present

## 2019-02-10 DIAGNOSIS — K831 Obstruction of bile duct: Secondary | ICD-10-CM | POA: Diagnosis not present

## 2019-02-10 DIAGNOSIS — N179 Acute kidney failure, unspecified: Secondary | ICD-10-CM | POA: Diagnosis not present

## 2019-02-10 DIAGNOSIS — K7010 Alcoholic hepatitis without ascites: Secondary | ICD-10-CM | POA: Diagnosis not present

## 2019-02-17 DIAGNOSIS — N179 Acute kidney failure, unspecified: Secondary | ICD-10-CM | POA: Diagnosis not present

## 2019-02-17 DIAGNOSIS — R600 Localized edema: Secondary | ICD-10-CM | POA: Diagnosis not present

## 2019-02-17 DIAGNOSIS — K7010 Alcoholic hepatitis without ascites: Secondary | ICD-10-CM | POA: Diagnosis not present

## 2019-02-17 DIAGNOSIS — R609 Edema, unspecified: Secondary | ICD-10-CM | POA: Diagnosis not present

## 2019-03-03 DIAGNOSIS — R600 Localized edema: Secondary | ICD-10-CM | POA: Diagnosis not present

## 2019-03-03 DIAGNOSIS — I10 Essential (primary) hypertension: Secondary | ICD-10-CM | POA: Diagnosis not present

## 2019-03-03 DIAGNOSIS — K7010 Alcoholic hepatitis without ascites: Secondary | ICD-10-CM | POA: Diagnosis not present

## 2019-03-27 DIAGNOSIS — M4726 Other spondylosis with radiculopathy, lumbar region: Secondary | ICD-10-CM | POA: Diagnosis not present

## 2019-03-27 DIAGNOSIS — G894 Chronic pain syndrome: Secondary | ICD-10-CM | POA: Diagnosis not present

## 2019-05-28 DIAGNOSIS — Z79899 Other long term (current) drug therapy: Secondary | ICD-10-CM | POA: Diagnosis not present

## 2019-05-28 DIAGNOSIS — M4726 Other spondylosis with radiculopathy, lumbar region: Secondary | ICD-10-CM | POA: Diagnosis not present

## 2019-05-28 DIAGNOSIS — G894 Chronic pain syndrome: Secondary | ICD-10-CM | POA: Diagnosis not present

## 2019-05-28 DIAGNOSIS — Z79891 Long term (current) use of opiate analgesic: Secondary | ICD-10-CM | POA: Diagnosis not present

## 2019-06-15 DIAGNOSIS — M25562 Pain in left knee: Secondary | ICD-10-CM | POA: Diagnosis not present

## 2019-06-15 DIAGNOSIS — M25561 Pain in right knee: Secondary | ICD-10-CM | POA: Diagnosis not present

## 2019-06-15 DIAGNOSIS — M1711 Unilateral primary osteoarthritis, right knee: Secondary | ICD-10-CM | POA: Diagnosis not present

## 2019-06-24 DIAGNOSIS — N179 Acute kidney failure, unspecified: Secondary | ICD-10-CM | POA: Diagnosis not present

## 2019-06-24 DIAGNOSIS — R011 Cardiac murmur, unspecified: Secondary | ICD-10-CM | POA: Diagnosis not present

## 2019-06-24 DIAGNOSIS — R7303 Prediabetes: Secondary | ICD-10-CM | POA: Diagnosis not present

## 2019-06-24 DIAGNOSIS — R600 Localized edema: Secondary | ICD-10-CM | POA: Diagnosis not present

## 2019-06-24 DIAGNOSIS — Z0000 Encounter for general adult medical examination without abnormal findings: Secondary | ICD-10-CM | POA: Diagnosis not present

## 2019-06-24 DIAGNOSIS — Z23 Encounter for immunization: Secondary | ICD-10-CM | POA: Diagnosis not present

## 2019-06-24 DIAGNOSIS — R7989 Other specified abnormal findings of blood chemistry: Secondary | ICD-10-CM | POA: Diagnosis not present

## 2019-06-24 DIAGNOSIS — I10 Essential (primary) hypertension: Secondary | ICD-10-CM | POA: Diagnosis not present

## 2019-06-24 DIAGNOSIS — K7010 Alcoholic hepatitis without ascites: Secondary | ICD-10-CM | POA: Diagnosis not present

## 2019-06-24 DIAGNOSIS — E782 Mixed hyperlipidemia: Secondary | ICD-10-CM | POA: Diagnosis not present

## 2019-06-24 DIAGNOSIS — M1711 Unilateral primary osteoarthritis, right knee: Secondary | ICD-10-CM | POA: Diagnosis not present

## 2019-06-26 NOTE — H&P (Signed)
TOTAL KNEE ADMISSION H&P  Patient is being admitted for right total knee arthroplasty.  Subjective:  Chief Complaint:   Right knee primary OA / pain  HPI: [name], 73 y.o. male, has a history of pain and functional disability in the right knee due to arthritis and has failed non-surgical conservative treatments for greater than 12 weeks to include NSAID's and/or analgesics, corticosteriod injections, viscosupplementation injections and activity modification.  Onset of symptoms was gradual, starting  years ago with gradually worsening course since that time. The patient noted no past surgery on the right knee(s).  Patient currently rates pain in the right knee(s) at 10 out of 10 with activity. Patient has night pain, worsening of pain with activity and weight bearing, pain that interferes with activities of daily living, pain with passive range of motion, crepitus and joint swelling.  Patient has evidence of periarticular osteophytes and joint space narrowing by imaging studies. There is no active infection.  Risks, benefits and expectations were discussed with the patient.  Risks including but not limited to the risk of anesthesia, blood clots, nerve damage, blood vessel damage, failure of the prosthesis, infection and up to and including death.  Patient understand the risks, benefits and expectations and wishes to proceed with surgery.   PCP: Olive Bass, MD  D/C Plans:       Home   Post-op Meds:       No Rx given  Tranexamic Acid:      To be given - IV   Decadron:      Is to be given  FYI:      ASA  Oxy (on tid pre-op)  DME:    Rx for equipment sent  PT:   OPPT   Pharmacy: [facility] - [address]    Patient Active Problem List   Diagnosis Date Noted  . S/P left THA, AA 12/18/2011   Past Medical History:  Diagnosis Date  . Arthritis    PAIN AND OA LEFT HIP--SOME PROBLEMS WITH RT HIP BUT NOTHING LIKE THE LEFT HIP  . Hypertension   . Rash, skin     RASH ON  LEGS--ITCHING -EVERY WINTER.  Marland Kitchen Shortness of breath    JUST WITH EXERTION  . Spastic esophagus    HX OF CHEST PAIN--STATES WENT TO HOSPTIAL YRS AGO--TOLD HEART OK--PAIN ATTRIBUTED TO SPASMS OF ESOPHAGUS    Past Surgical History:  Procedure Laterality Date  . DUPYENTRENS CONTRACTURE REPAIR     RIGHT HAND REPAIR X 2 AND LEFT HAND X 1  . HERNIA REPAIR     LAPAROSCOPIC INGUINAL HERNIA REPAIR WITH MESH- RIGHT  . TOTAL HIP ARTHROPLASTY  12/18/2011   Procedure: TOTAL HIP ARTHROPLASTY ANTERIOR APPROACH;  Surgeon: Shelda Pal, MD;  Location: [facility];  Service: Orthopedics;  Laterality: Left;    No current facility-administered medications for this encounter.    Current Outpatient Medications  Medication Sig Dispense Refill Last Dose  . Amlodipine-Valsartan-HCTZ (EXFORGE HCT) 10-320-25 MG TABS Take by mouth. ONE EVERY AM   12/18/2011 at 0630  . Ascorbic Acid (VITAMIN C) 1000 MG tablet Take 1,000 mg by mouth every morning.   Unknown at Unknown  . atorvastatin (LIPITOR) 10 MG tablet Take 10 mg by mouth every morning.   12/18/2011 at 0630  . diphenhydrAMINE (BENADRYL) 25 mg capsule Take 1 capsule (25 mg total) by mouth every 6 (six) hours as needed for itching, allergies or sleep.     . ferrous sulfate 325 (65  FE) MG tablet Take 1 tablet (325 mg total) by mouth 3 (three) times daily after meals.     Marland Kitchen. glucosamine-chondroitin 500-400 MG tablet Take 1 tablet by mouth 2 (two) times daily.   Past Month at Unknown  . hydrocortisone cream 1 % Apply 1 application topically 2 (two) times daily as needed. For itching   12/15/11  . Multiple Vitamin (MULITIVITAMIN WITH MINERALS) TABS Take 1 tablet by mouth every morning.   Unknown at Unknown  . tetrahydrozoline (VISINE) 0.05 % ophthalmic solution Place 1 drop into both eyes 2 (two) times daily as needed. For dry irritated eyes   Unknown at Unknown   No Known Allergies  Social History   Tobacco Use  . Smoking status: Former Smoker    Packs/day: 1.00    Years:  30.00    Pack years: 30.00    Types: Cigarettes  . Smokeless tobacco: Never Used  . Tobacco comment: QUIT SMOKING 2006  Substance Use Topics  . Alcohol use: Yes    Comment: NO ALCOHOL IN THE PAST MONTH--BEFORE THAT-DAILY USE--WAS NEVER ALCOHOLIC       Review of Systems  Constitutional: Negative.   HENT: Negative.   Eyes: Negative.   Respiratory: Negative.   Cardiovascular: Negative.   Gastrointestinal: Negative.   Genitourinary: Positive for frequency.  Musculoskeletal: Positive for back pain and joint pain.  Skin: Negative.   Neurological: Negative.   Endo/Heme/Allergies: Negative.   Psychiatric/Behavioral: Negative.     Objective:  Physical Exam  Constitutional: He is oriented to person, place, and time. He appears well-developed.  HENT:  Head: Normocephalic.  Eyes: Pupils are equal, round, and reactive to light.  Neck: Neck supple. No JVD present. No tracheal deviation present. No thyromegaly present.  Cardiovascular: Normal rate, regular rhythm and intact distal pulses.  Respiratory: Effort normal and breath sounds normal. No respiratory distress. He has no wheezes.  GI: Soft. There is no abdominal tenderness. There is no guarding.  Musculoskeletal:     Right knee: He exhibits decreased range of motion, swelling and bony tenderness. He exhibits no ecchymosis, no deformity, no laceration and no erythema. Tenderness found.  Lymphadenopathy:    He has no cervical adenopathy.  Neurological: He is alert and oriented to person, place, and time.  Skin: Skin is warm and dry.  Psychiatric: He has a normal mood and affect.      Labs:  Estimated body mass index is 33 kg/m as calculated from the following:   Height as of 12/18/11: 5\' 10"  (1.778 m).   Weight as of 12/18/11: 104.3 kg.   Imaging Review Plain radiographs demonstrate severe degenerative joint disease of the right knee(s).  The bone quality appears to be good for age and reported activity  level.      Assessment/Plan:  End stage arthritis, right knee   The patient history, physical examination, clinical judgment of the provider and imaging studies are consistent with end stage degenerative joint disease of the right knee(s) and total knee arthroplasty is deemed medically necessary. The treatment options including medical management, injection therapy arthroscopy and arthroplasty were discussed at length. The risks and benefits of total knee arthroplasty were presented and reviewed. The risks due to aseptic loosening, infection, stiffness, patella tracking problems, thromboembolic complications and other imponderables were discussed. The patient acknowledged the explanation, agreed to proceed with the plan and consent was signed. Patient is being admitted for inpatient treatment for surgery, pain control, PT, OT, prophylactic antibiotics, VTE prophylaxis, progressive ambulation and ADL's  and discharge planning. The patient is planning to be discharged home.     Patient's anticipated LOS is less than 2 midnights, meeting these requirements: - Lives within 1 hour of care - Has a competent adult at home to recover with post-op recover - NO history of  - Chronic pain requiring opiods  - Diabetes  - Coronary Artery Disease  - Heart failure  - Heart attack  - Stroke  - DVT/VTE  - Cardiac arrhythmia  - Respiratory Failure/COPD  - Renal failure  - Anemia  - Advanced Liver disease    West Pugh. Kyla Duffy   PA-C  06/26/2019, 2:44 PM

## 2019-07-02 DIAGNOSIS — R011 Cardiac murmur, unspecified: Secondary | ICD-10-CM

## 2019-07-02 HISTORY — DX: Cardiac murmur, unspecified: R01.1

## 2019-07-09 NOTE — Patient Instructions (Addendum)
DUE TO COVID-19 ONLY ONE VISITOR IS ALLOWED TO COME WITH YOU AND STAY IN THE WAITING ROOM ONLY DURING PRE OP AND PROCEDURE DAY OF SURGERY. THE 1 VISITOR MAY VISIT WITH YOU AFTER SURGERY IN YOUR PRIVATE ROOM DURING VISITING HOURS ONLY!  YOU NEED TO HAVE A COVID 19 TEST ON___10/23____ @_12 :20______, THIS TEST MUST BE DONE BEFORE SURGERY, COME  [address] , [id number].  ([place]) ONCE YOUR COVID TEST IS COMPLETED, PLEASE BEGIN THE QUARANTINE INSTRUCTIONS AS OUTLINED IN YOUR HANDOUT.                [name]    Your procedure is scheduled on: 07/14/19   Report to [facility] Main  Entrance   Report to admitting at   7:25 AM     Call this number if you have problems the morning of surgery [place], [place].   Do not eat food After Midnight.   YOU MAY HAVE CLEAR LIQUIDS FROM MIDNIGHT UNTIL 4:30 AM.   At 4:30 AM Please finish the prescribed Pre-Surgery Gatorade drink.   Nothing by mouth after you finish the Gatorade drink !  Take these medicines the morning of surgery with A SIP OF WATER:                                  You may not have any metal on your body including piercings             Do not wear jewelry,  lotions, powders or  deodorant                   Men may shave face and neck.   Do not bring valuables to the hospital. [place].  Contacts, dentures or bridgework may not be worn into surgery.   Name and phone number of your driver:  Special Instructions: N/A              Please read over the following fact sheets you were given: _____________________________________________________________________             [facility] - Preparing for Surgery  Before surgery, you can play an important role.   Because skin is not sterile, your skin needs to be as free of germs as  possible.   You can reduce the number of germs on your skin by washing with CHG (chlorahexidine gluconate) soap before surgery.   CHG is an antiseptic cleaner which kills germs and bonds with the skin to continue killing germs even after washing. Please DO NOT use if you have an allergy to CHG or antibacterial soaps .  If your skin becomes reddened/irritated stop using the CHG and inform your nurse when you arrive at Short Stay. You may shave your face/neck.  Please follow these instructions carefully:  1.  Shower with CHG Soap the night before surgery and the  morning of Surgery.  2.  If you choose to wash your hair, wash your hair first as usual with your  normal  shampoo.  3.  After you shampoo, rinse your hair and body thoroughly to remove the  shampoo.  4.  Use CHG as you would any other liquid soap.  You can apply chg directly  to the skin and wash                       Gently with a scrungie or clean washcloth.  5.  Apply the CHG Soap to your body ONLY FROM THE NECK DOWN.   Do not use on face/ open                           Wound or open sores. Avoid contact with eyes, ears mouth and genitals (private parts).                       Wash face,  Genitals (private parts) with your normal soap.             6.  Wash thoroughly, paying special attention to the area where your surgery  will be performed.  7.  Thoroughly rinse your body with warm water from the neck down.  8.  DO NOT shower/wash with your normal soap after using and rinsing off  the CHG Soap.             9.  Pat yourself dry with a clean towel.            10.  Wear clean pajamas.            11.  Place clean sheets on your bed the night of your first shower and do not  sleep with pets. Day of Surgery : Do not apply any lotions/deodorants the morning of surgery.  Please wear clean clothes to the hospital/surgery center.  FAILURE TO FOLLOW THESE INSTRUCTIONS MAY RESULT IN THE CANCELLATION OF  YOUR SURGERY PATIENT SIGNATURE_________________________________  NURSE SIGNATURE__________________________________  ________________________________________________________________________   Adam Phenix  An incentive spirometer is a tool that can help keep your lungs clear and active. This tool measures how well you are filling your lungs with each breath. Taking long deep breaths may help reverse or decrease the chance of developing breathing (pulmonary) problems (especially infection) following:  A long period of time when you are unable to move or be active. BEFORE THE PROCEDURE   If the spirometer includes an indicator to show your best effort, your nurse or respiratory therapist will set it to a desired goal.  If possible, sit up straight or lean slightly forward. Try not to slouch.  Hold the incentive spirometer in an upright position. INSTRUCTIONS FOR USE  1. Sit on the edge of your bed if possible, or sit up as far as you can in bed or on a chair. 2. Hold the incentive spirometer in an upright position. 3. Breathe out normally. 4. Place the mouthpiece in your mouth and seal your lips tightly around it. 5. Breathe in slowly and as deeply as possible, raising the piston or the ball toward the top of the column. 6. Hold your breath for 3-5 seconds or for as long as possible. Allow the piston or ball to fall to the bottom of the column. 7. Remove the mouthpiece from your mouth and breathe out normally. 8. Rest for a few seconds and repeat Steps 1 through 7 at least 10 times every 1-2 hours when you are awake. Take your time and take a few normal breaths between deep breaths. 9. The spirometer may include an indicator to show your best effort.  Use the indicator as a goal to work toward during each repetition. 10. After each set of 10 deep breaths, practice coughing to be sure your lungs are clear. If you have an incision (the cut made at the time of surgery), support your  incision when coughing by placing a pillow or rolled up towels firmly against it. Once you are able to get out of bed, walk around indoors and cough well. You may stop using the incentive spirometer when instructed by your caregiver.  RISKS AND COMPLICATIONS  Take your time so you do not get dizzy or light-headed.  If you are in pain, you may need to take or ask for pain medication before doing incentive spirometry. It is harder to take a deep breath if you are having pain. AFTER USE  Rest and breathe slowly and easily.  It can be helpful to keep track of a log of your progress. Your caregiver can provide you with a simple table to help with this. If you are using the spirometer at home, follow these instructions: [place] IF:   You are having difficultly using the spirometer.  You have trouble using the spirometer as often as instructed.  Your pain medication is not giving enough relief while using the spirometer.  You develop fever of 100.5 F (38.1 C) or higher. SEEK IMMEDIATE MEDICAL CARE IF:   You cough up bloody sputum that had not been present before.  You develop fever of 102 F (38.9 C) or greater.  You develop worsening pain at or near the incision site. MAKE SURE YOU:   Understand these instructions.  Will watch your condition.  Will get help right away if you are not doing well or get worse. Document Released: 01/14/2007 Document Revised: 11/26/2011 Document Reviewed: 03/17/2007 [facility] Patient Information 2014 [place], [place].   ________________________________________________________________________

## 2019-07-10 ENCOUNTER — Other Ambulatory Visit (HOSPITAL_COMMUNITY)
Admission: RE | Admit: 2019-07-10 | Discharge: 2019-07-10 | Disposition: A | Discharge status: Home / Self Care | Payer: PPO | Source: Ambulatory Visit | Attending: Orthopedic Surgery | Admitting: Orthopedic Surgery

## 2019-07-10 ENCOUNTER — Encounter (HOSPITAL_COMMUNITY)
Admission: RE | Admit: 2019-07-10 | Discharge: 2019-07-10 | Disposition: A | Discharge status: Home / Self Care | Payer: PPO | Source: Ambulatory Visit | Attending: Orthopedic Surgery | Admitting: Orthopedic Surgery

## 2019-07-10 ENCOUNTER — Encounter (HOSPITAL_COMMUNITY): Payer: Self-pay

## 2019-07-10 ENCOUNTER — Other Ambulatory Visit: Payer: Self-pay

## 2019-07-10 DIAGNOSIS — M1711 Unilateral primary osteoarthritis, right knee: Secondary | ICD-10-CM | POA: Diagnosis not present

## 2019-07-10 DIAGNOSIS — Z20828 Contact with and (suspected) exposure to other viral communicable diseases: Secondary | ICD-10-CM | POA: Diagnosis not present

## 2019-07-10 DIAGNOSIS — I10 Essential (primary) hypertension: Secondary | ICD-10-CM | POA: Diagnosis not present

## 2019-07-10 DIAGNOSIS — M25561 Pain in right knee: Secondary | ICD-10-CM | POA: Insufficient documentation

## 2019-07-10 DIAGNOSIS — Z01818 Encounter for other preprocedural examination: Secondary | ICD-10-CM | POA: Diagnosis not present

## 2019-07-10 LAB — CBC
HCT: 41.4 % (ref 39.0–52.0)
Hemoglobin: 13.3 g/dL (ref 13.0–17.0)
MCH: 30.4 pg (ref 26.0–34.0)
MCHC: 32.1 g/dL (ref 30.0–36.0)
MCV: 94.5 fL (ref 80.0–100.0)
Platelets: 173 10*3/uL (ref 150–400)
RBC: 4.38 MIL/uL (ref 4.22–5.81)
RDW: 15.9 % — ABNORMAL HIGH (ref 11.5–15.5)
WBC: 8.8 10*3/uL (ref 4.0–10.5)
nRBC: 0 % (ref 0.0–0.2)

## 2019-07-10 LAB — BASIC METABOLIC PANEL
Anion gap: 10 (ref 5–15)
BUN: 11 mg/dL (ref 8–23)
CO2: 24 mmol/L (ref 22–32)
Calcium: 8.9 mg/dL (ref 8.9–10.3)
Chloride: 103 mmol/L (ref 98–111)
Creatinine, Ser: 0.85 mg/dL (ref 0.61–1.24)
GFR calc Af Amer: >60 mL/min (ref >60)
GFR calc non Af Amer: >60 mL/min (ref >60)
Glucose, Bld: 104 mg/dL — ABNORMAL HIGH (ref 70–99)
Potassium: 4.3 mmol/L (ref 3.5–5.1)
Sodium: 137 mmol/L (ref 135–145)

## 2019-07-10 LAB — HEMOGLOBIN A1C
Hgb A1c MFr Bld: 5.3 % (ref 4.8–5.6)
Mean Plasma Glucose: 105.41 mg/dL

## 2019-07-10 LAB — SURGICAL PCR SCREEN
MRSA, PCR: NEGATIVE
Staphylococcus aureus: NEGATIVE

## 2019-07-10 NOTE — Progress Notes (Signed)
PCP - Dr. Trula Slade Cardiologist - none  Chest x-ray - no EKG - 07/10/19 Stress Test - no ECHO - no Cardiac Cath - no  Sleep Study - no CPAP -   Fasting Blood Sugar - NA Checks Blood Sugar _____ times a day  Blood Thinner Instructions:ASA Aspirin Instructions:Dr. Dough stop 5 days prior Last Dose:07/04/19  Anesthesia review:   Patient denies shortness of breath, fever, cough and chest pain at PAT appointment yes  Patient verbalized understanding of instructions that were given to them at the PAT appointment. Patient was also instructed that they will need to review over the PAT instructions again at home before surgery. yes

## 2019-07-11 LAB — NOVEL CORONAVIRUS, NAA (HOSP ORDER, SEND-OUT TO REF LAB; TAT 18-24 HRS): SARS-CoV-2, NAA: NOT DETECTED

## 2019-07-14 ENCOUNTER — Observation Stay (HOSPITAL_COMMUNITY)
Admission: RE | Admit: 2019-07-14 | Discharge: 2019-07-15 | Disposition: A | Discharge status: Home / Self Care | Payer: PPO | Attending: Orthopedic Surgery | Admitting: Orthopedic Surgery

## 2019-07-14 ENCOUNTER — Ambulatory Visit (HOSPITAL_COMMUNITY): Payer: PPO | Admitting: Anesthesiology

## 2019-07-14 ENCOUNTER — Other Ambulatory Visit: Payer: Self-pay

## 2019-07-14 ENCOUNTER — Encounter (HOSPITAL_COMMUNITY): Payer: Self-pay | Admitting: *Deleted

## 2019-07-14 ENCOUNTER — Encounter (HOSPITAL_COMMUNITY)
Admission: RE | Disposition: A | Discharge status: Home / Self Care | Payer: Self-pay | Source: Home / Self Care | Attending: Orthopedic Surgery

## 2019-07-14 DIAGNOSIS — M1711 Unilateral primary osteoarthritis, right knee: Secondary | ICD-10-CM | POA: Diagnosis not present

## 2019-07-14 DIAGNOSIS — Z7982 Long term (current) use of aspirin: Secondary | ICD-10-CM | POA: Insufficient documentation

## 2019-07-14 DIAGNOSIS — Z87891 Personal history of nicotine dependence: Secondary | ICD-10-CM | POA: Diagnosis not present

## 2019-07-14 DIAGNOSIS — M25461 Effusion, right knee: Secondary | ICD-10-CM | POA: Diagnosis not present

## 2019-07-14 DIAGNOSIS — I10 Essential (primary) hypertension: Secondary | ICD-10-CM | POA: Diagnosis not present

## 2019-07-14 DIAGNOSIS — Z6834 Body mass index (BMI) 34.0-34.9, adult: Secondary | ICD-10-CM | POA: Diagnosis not present

## 2019-07-14 DIAGNOSIS — M25761 Osteophyte, right knee: Secondary | ICD-10-CM | POA: Diagnosis not present

## 2019-07-14 DIAGNOSIS — Z79899 Other long term (current) drug therapy: Secondary | ICD-10-CM | POA: Diagnosis not present

## 2019-07-14 DIAGNOSIS — Z96642 Presence of left artificial hip joint: Secondary | ICD-10-CM | POA: Insufficient documentation

## 2019-07-14 DIAGNOSIS — G8918 Other acute postprocedural pain: Secondary | ICD-10-CM | POA: Diagnosis not present

## 2019-07-14 DIAGNOSIS — E669 Obesity, unspecified: Secondary | ICD-10-CM | POA: Diagnosis not present

## 2019-07-14 DIAGNOSIS — Z96651 Presence of right artificial knee joint: Secondary | ICD-10-CM

## 2019-07-14 HISTORY — PX: TOTAL KNEE ARTHROPLASTY: SHX125

## 2019-07-14 LAB — TYPE AND SCREEN
ABO/RH(D): A POS
Antibody Screen: NEGATIVE

## 2019-07-14 SURGERY — ARTHROPLASTY, KNEE, TOTAL
Anesthesia: Spinal | Site: Knee | Laterality: Right

## 2019-07-14 MED ORDER — HYDROMORPHONE HCL 1 MG/ML IJ SOLN: 0.5000 mg | INTRAMUSCULAR | Status: DC | PRN

## 2019-07-14 MED ORDER — IRBESARTAN-HYDROCHLOROTHIAZIDE 300-12.5 MG PO TABS: 1.0000 | ORAL_TABLET | Freq: Every day | ORAL | Status: DC

## 2019-07-14 MED ORDER — ROPIVACAINE HCL 7.5 MG/ML IJ SOLN
INTRAMUSCULAR | Status: DC | PRN
  Administered 2019-07-14: 20 mL via PERINEURAL

## 2019-07-14 MED ORDER — SODIUM CHLORIDE (PF) 0.9 % IJ SOLN
INTRAMUSCULAR | Status: DC | PRN
  Administered 2019-07-14: 30 mL

## 2019-07-14 MED ORDER — LIDOCAINE 2% (20 MG/ML) 5 ML SYRINGE
INTRAMUSCULAR | Status: DC | PRN
  Administered 2019-07-14: 40 mg via INTRAVENOUS

## 2019-07-14 MED ORDER — MENTHOL 3 MG MT LOZG: 1.0000 | LOZENGE | OROMUCOSAL | Status: DC | PRN

## 2019-07-14 MED ORDER — DIPHENHYDRAMINE HCL 12.5 MG/5ML PO ELIX: 12.5000 mg | ORAL_SOLUTION | ORAL | Status: DC | PRN

## 2019-07-14 MED ORDER — HYDROCHLOROTHIAZIDE 12.5 MG PO CAPS
12.5000 mg | ORAL_CAPSULE | Freq: Every day | ORAL | Status: DC
  Administered 2019-07-14 – 2019-07-15 (×2): 12.5 mg via ORAL
  Filled 2019-07-14 (×2): qty 1

## 2019-07-14 MED ORDER — SODIUM CHLORIDE 0.9 % IV SOLN
INTRAVENOUS | Status: DC | PRN
  Administered 2019-07-14: 35 ug/min via INTRAVENOUS

## 2019-07-14 MED ORDER — PROPOFOL 500 MG/50ML IV EMUL
INTRAVENOUS | Status: DC | PRN
  Administered 2019-07-14: 50 ug/kg/min via INTRAVENOUS

## 2019-07-14 MED ORDER — MAGNESIUM CITRATE PO SOLN: 1.0000 | Freq: Once | ORAL | Status: DC | PRN

## 2019-07-14 MED ORDER — 0.9 % SODIUM CHLORIDE (POUR BTL) OPTIME
TOPICAL | Status: DC | PRN
  Administered 2019-07-14: 1000 mL

## 2019-07-14 MED ORDER — CEFAZOLIN SODIUM-DEXTROSE 2-4 GM/100ML-% IV SOLN
2.0000 g | INTRAVENOUS | Status: AC
  Administered 2019-07-14: 2 g via INTRAVENOUS
  Filled 2019-07-14: qty 100

## 2019-07-14 MED ORDER — BISACODYL 10 MG RE SUPP: 10.0000 mg | Freq: Every day | RECTAL | Status: DC | PRN

## 2019-07-14 MED ORDER — KETOROLAC TROMETHAMINE 30 MG/ML IJ SOLN
INTRAMUSCULAR | Status: AC
  Filled 2019-07-14: qty 1

## 2019-07-14 MED ORDER — OXYMETAZOLINE HCL 0.05 % NA SOLN: 1.0000 | Freq: Every evening | NASAL | Status: DC | PRN

## 2019-07-14 MED ORDER — ONDANSETRON HCL 4 MG PO TABS: 4.0000 mg | ORAL_TABLET | Freq: Four times a day (QID) | ORAL | Status: DC | PRN

## 2019-07-14 MED ORDER — SODIUM CHLORIDE 0.9 % IR SOLN
Status: DC | PRN
  Administered 2019-07-14: 1000 mL

## 2019-07-14 MED ORDER — PHENYLEPHRINE 40 MCG/ML (10ML) SYRINGE FOR IV PUSH (FOR BLOOD PRESSURE SUPPORT)
PREFILLED_SYRINGE | INTRAVENOUS | Status: DC | PRN
  Administered 2019-07-14: 80 ug via INTRAVENOUS

## 2019-07-14 MED ORDER — LACTATED RINGERS IV SOLN
INTRAVENOUS | Status: DC
  Administered 2019-07-14 (×2): via INTRAVENOUS

## 2019-07-14 MED ORDER — METHOCARBAMOL 500 MG PO TABS
500.0000 mg | ORAL_TABLET | Freq: Four times a day (QID) | ORAL | Status: DC | PRN
  Administered 2019-07-15: 500 mg via ORAL
  Filled 2019-07-14: qty 1

## 2019-07-14 MED ORDER — CEFAZOLIN SODIUM-DEXTROSE 2-4 GM/100ML-% IV SOLN
2.0000 g | Freq: Four times a day (QID) | INTRAVENOUS | Status: AC
  Administered 2019-07-14 (×2): 2 g via INTRAVENOUS
  Filled 2019-07-14 (×2): qty 100

## 2019-07-14 MED ORDER — PROPOFOL 500 MG/50ML IV EMUL
INTRAVENOUS | Status: AC
  Filled 2019-07-14: qty 50

## 2019-07-14 MED ORDER — ASPIRIN 81 MG PO CHEW
81.0000 mg | CHEWABLE_TABLET | Freq: Two times a day (BID) | ORAL | Status: DC
  Administered 2019-07-14 – 2019-07-15 (×2): 81 mg via ORAL
  Filled 2019-07-14 (×2): qty 1

## 2019-07-14 MED ORDER — PROPOFOL 10 MG/ML IV BOLUS
INTRAVENOUS | Status: AC
  Filled 2019-07-14: qty 20

## 2019-07-14 MED ORDER — IRBESARTAN 150 MG PO TABS
300.0000 mg | ORAL_TABLET | Freq: Every day | ORAL | Status: DC
  Administered 2019-07-14 – 2019-07-15 (×2): 300 mg via ORAL
  Filled 2019-07-14 (×2): qty 2

## 2019-07-14 MED ORDER — OXYCODONE HCL 5 MG PO TABS
20.0000 mg | ORAL_TABLET | ORAL | Status: DC | PRN
  Administered 2019-07-14 – 2019-07-15 (×4): 20 mg via ORAL
  Filled 2019-07-14 (×4): qty 4

## 2019-07-14 MED ORDER — ATORVASTATIN CALCIUM 20 MG PO TABS
20.0000 mg | ORAL_TABLET | Freq: Every day | ORAL | Status: DC
  Administered 2019-07-15: 20 mg via ORAL
  Filled 2019-07-14: qty 1

## 2019-07-14 MED ORDER — MIDAZOLAM HCL 2 MG/2ML IJ SOLN
1.0000 mg | INTRAMUSCULAR | Status: DC
  Administered 2019-07-14: 10:00:00 1 mg via INTRAVENOUS
  Filled 2019-07-14: qty 2

## 2019-07-14 MED ORDER — PHENOL 1.4 % MT LIQD: 1.0000 | OROMUCOSAL | Status: DC | PRN

## 2019-07-14 MED ORDER — FERROUS SULFATE 325 (65 FE) MG PO TABS
325.0000 mg | ORAL_TABLET | Freq: Two times a day (BID) | ORAL | Status: DC
  Administered 2019-07-15: 325 mg via ORAL
  Filled 2019-07-14: qty 1

## 2019-07-14 MED ORDER — DEXAMETHASONE SODIUM PHOSPHATE 10 MG/ML IJ SOLN
10.0000 mg | Freq: Once | INTRAMUSCULAR | Status: AC
  Administered 2019-07-14: 11:00:00 10 mg via INTRAVENOUS

## 2019-07-14 MED ORDER — DEXAMETHASONE SODIUM PHOSPHATE 10 MG/ML IJ SOLN
INTRAMUSCULAR | Status: AC
  Filled 2019-07-14: qty 1

## 2019-07-14 MED ORDER — BUPIVACAINE IN DEXTROSE 0.75-8.25 % IT SOLN
INTRATHECAL | Status: DC | PRN
  Administered 2019-07-14: 1.6 mL via INTRATHECAL

## 2019-07-14 MED ORDER — ALUM & MAG HYDROXIDE-SIMETH 200-200-20 MG/5ML PO SUSP: 15.0000 mL | ORAL | Status: DC | PRN

## 2019-07-14 MED ORDER — KETOROLAC TROMETHAMINE 30 MG/ML IJ SOLN
INTRAMUSCULAR | Status: DC | PRN
  Administered 2019-07-14: 30 mg via INTRA_ARTICULAR

## 2019-07-14 MED ORDER — TRANEXAMIC ACID-NACL 1000-0.7 MG/100ML-% IV SOLN
1000.0000 mg | Freq: Once | INTRAVENOUS | Status: AC
  Administered 2019-07-14: 1000 mg via INTRAVENOUS
  Filled 2019-07-14: qty 100

## 2019-07-14 MED ORDER — SODIUM CHLORIDE (PF) 0.9 % IJ SOLN
INTRAMUSCULAR | Status: AC
  Filled 2019-07-14: qty 50

## 2019-07-14 MED ORDER — ONDANSETRON HCL 4 MG/2ML IJ SOLN: 4.0000 mg | Freq: Four times a day (QID) | INTRAMUSCULAR | Status: DC | PRN

## 2019-07-14 MED ORDER — CHLORHEXIDINE GLUCONATE 4 % EX LIQD: 60.0000 mL | Freq: Once | CUTANEOUS | Status: DC

## 2019-07-14 MED ORDER — CELECOXIB 200 MG PO CAPS
200.0000 mg | ORAL_CAPSULE | Freq: Two times a day (BID) | ORAL | Status: DC
  Administered 2019-07-14 – 2019-07-15 (×2): 200 mg via ORAL
  Filled 2019-07-14 (×2): qty 1

## 2019-07-14 MED ORDER — STERILE WATER FOR IRRIGATION IR SOLN
Status: DC | PRN
  Administered 2019-07-14: 2000 mL

## 2019-07-14 MED ORDER — LORATADINE 10 MG PO TABS: 10.0000 mg | ORAL_TABLET | Freq: Every day | ORAL | Status: DC | PRN

## 2019-07-14 MED ORDER — PROPOFOL 10 MG/ML IV BOLUS
INTRAVENOUS | Status: DC | PRN
  Administered 2019-07-14 (×3): 20 mg via INTRAVENOUS

## 2019-07-14 MED ORDER — ONDANSETRON HCL 4 MG/2ML IJ SOLN
INTRAMUSCULAR | Status: DC | PRN
  Administered 2019-07-14: 4 mg via INTRAVENOUS

## 2019-07-14 MED ORDER — ONDANSETRON HCL 4 MG/2ML IJ SOLN
INTRAMUSCULAR | Status: AC
  Filled 2019-07-14: qty 2

## 2019-07-14 MED ORDER — BUPIVACAINE HCL (PF) 0.25 % IJ SOLN
INTRAMUSCULAR | Status: DC | PRN
  Administered 2019-07-14: 30 mL

## 2019-07-14 MED ORDER — METOCLOPRAMIDE HCL 5 MG/ML IJ SOLN: 5.0000 mg | Freq: Three times a day (TID) | INTRAMUSCULAR | Status: DC | PRN

## 2019-07-14 MED ORDER — DEXAMETHASONE SODIUM PHOSPHATE 10 MG/ML IJ SOLN
10.0000 mg | Freq: Once | INTRAMUSCULAR | Status: AC
  Administered 2019-07-15: 10 mg via INTRAVENOUS
  Filled 2019-07-14: qty 1

## 2019-07-14 MED ORDER — FENTANYL CITRATE (PF) 100 MCG/2ML IJ SOLN
50.0000 ug | INTRAMUSCULAR | Status: DC
  Administered 2019-07-14: 10:00:00 50 ug via INTRAVENOUS
  Filled 2019-07-14: qty 2

## 2019-07-14 MED ORDER — BUPIVACAINE HCL (PF) 0.25 % IJ SOLN
INTRAMUSCULAR | Status: AC
  Filled 2019-07-14: qty 30

## 2019-07-14 MED ORDER — POVIDONE-IODINE 10 % EX SWAB
2.0000 "application " | Freq: Once | CUTANEOUS | Status: AC
  Administered 2019-07-14: 2 via TOPICAL

## 2019-07-14 MED ORDER — LIDOCAINE 2% (20 MG/ML) 5 ML SYRINGE
INTRAMUSCULAR | Status: AC
  Filled 2019-07-14: qty 5

## 2019-07-14 MED ORDER — AMLODIPINE BESYLATE 5 MG PO TABS
5.0000 mg | ORAL_TABLET | Freq: Every day | ORAL | Status: DC
  Administered 2019-07-14 – 2019-07-15 (×2): 5 mg via ORAL
  Filled 2019-07-14 (×2): qty 1

## 2019-07-14 MED ORDER — METHOCARBAMOL 500 MG IVPB - SIMPLE MED
500.0000 mg | Freq: Four times a day (QID) | INTRAVENOUS | Status: DC | PRN
  Filled 2019-07-14: qty 50

## 2019-07-14 MED ORDER — TRANEXAMIC ACID-NACL 1000-0.7 MG/100ML-% IV SOLN
1000.0000 mg | INTRAVENOUS | Status: AC
  Administered 2019-07-14: 11:00:00 1000 mg via INTRAVENOUS
  Filled 2019-07-14: qty 100

## 2019-07-14 MED ORDER — SODIUM CHLORIDE 0.9 % IV SOLN
INTRAVENOUS | Status: DC
  Administered 2019-07-14 – 2019-07-15 (×2): via INTRAVENOUS

## 2019-07-14 MED ORDER — OXYCODONE HCL 5 MG PO TABS
10.0000 mg | ORAL_TABLET | ORAL | Status: DC | PRN
  Administered 2019-07-14 (×2): 10 mg via ORAL
  Filled 2019-07-14 (×2): qty 2

## 2019-07-14 MED ORDER — PHENYLEPHRINE 40 MCG/ML (10ML) SYRINGE FOR IV PUSH (FOR BLOOD PRESSURE SUPPORT)
PREFILLED_SYRINGE | INTRAVENOUS | Status: AC
  Filled 2019-07-14: qty 10

## 2019-07-14 MED ORDER — METOCLOPRAMIDE HCL 5 MG PO TABS: 5.0000 mg | ORAL_TABLET | Freq: Three times a day (TID) | ORAL | Status: DC | PRN

## 2019-07-14 MED ORDER — DOCUSATE SODIUM 100 MG PO CAPS
100.0000 mg | ORAL_CAPSULE | Freq: Two times a day (BID) | ORAL | Status: DC
  Administered 2019-07-14: 22:00:00 100 mg via ORAL
  Filled 2019-07-14: qty 1

## 2019-07-14 MED ORDER — POLYETHYLENE GLYCOL 3350 17 G PO PACK
17.0000 g | PACK | Freq: Two times a day (BID) | ORAL | Status: DC
  Filled 2019-07-14: qty 1

## 2019-07-14 MED ORDER — ACETAMINOPHEN 500 MG PO TABS
1000.0000 mg | ORAL_TABLET | Freq: Four times a day (QID) | ORAL | Status: AC
  Administered 2019-07-14 – 2019-07-15 (×4): 1000 mg via ORAL
  Filled 2019-07-14 (×4): qty 2

## 2019-07-14 SURGICAL SUPPLY — 63 items
ATTUNE MED ANAT PAT 38 KNEE (Knees) ×1 IMPLANT
ATTUNE MED ANAT PAT 38MM KNEE (Knees) ×1 IMPLANT
ATTUNE PS FEM RT SZ 6 CEM KNEE (Femur) ×2 IMPLANT
ATTUNE PSRP INSR SZ6 6 KNEE (Insert) ×1 IMPLANT
ATTUNE PSRP INSR SZ6 6MM KNEE (Insert) ×1 IMPLANT
BAG ZIPLOCK 12X15 (MISCELLANEOUS) IMPLANT
BASE TIBIAL ROT PLAT SZ 7 KNEE (Knees) IMPLANT
BLADE SAW SGTL 11.0X1.19X90.0M (BLADE) IMPLANT
BLADE SAW SGTL 13.0X1.19X90.0M (BLADE) ×3 IMPLANT
BLADE SURG SZ10 CARB STEEL (BLADE) ×6 IMPLANT
BNDG ELASTIC 6X10 VLCR STRL LF (GAUZE/BANDAGES/DRESSINGS) ×2 IMPLANT
BNDG ELASTIC 6X5.8 VLCR STR LF (GAUZE/BANDAGES/DRESSINGS) ×3 IMPLANT
BOWL SMART MIX CTS (DISPOSABLE) ×3 IMPLANT
CEMENT HV SMART SET (Cement) ×4 IMPLANT
COVER SURGICAL LIGHT HANDLE (MISCELLANEOUS) ×3 IMPLANT
COVER WAND RF STERILE (DRAPES) IMPLANT
CUFF TOURN SGL QUICK 34 (TOURNIQUET CUFF) ×2
CUFF TRNQT CYL 34X4.125X (TOURNIQUET CUFF) ×1 IMPLANT
DECANTER SPIKE VIAL GLASS SM (MISCELLANEOUS) ×6 IMPLANT
DERMABOND ADVANCED (GAUZE/BANDAGES/DRESSINGS) ×2
DERMABOND ADVANCED .7 DNX12 (GAUZE/BANDAGES/DRESSINGS) ×1 IMPLANT
DRAPE U-SHAPE 47X51 STRL (DRAPES) ×3 IMPLANT
DRESSING AQUACEL AG SP 3.5X10 (GAUZE/BANDAGES/DRESSINGS) ×1 IMPLANT
DRSG AQUACEL AG ADV 3.5X10 (GAUZE/BANDAGES/DRESSINGS) ×2 IMPLANT
DRSG AQUACEL AG SP 3.5X10 (GAUZE/BANDAGES/DRESSINGS) ×3
DURAPREP 26ML APPLICATOR (WOUND CARE) ×6 IMPLANT
ELECT REM PT RETURN 15FT ADLT (MISCELLANEOUS) ×3 IMPLANT
GLOVE BIO SURGEON STRL SZ 6 (GLOVE) ×3 IMPLANT
GLOVE BIOGEL PI IND STRL 6.5 (GLOVE) ×1 IMPLANT
GLOVE BIOGEL PI IND STRL 7.5 (GLOVE) ×1 IMPLANT
GLOVE BIOGEL PI IND STRL 8.5 (GLOVE) ×1 IMPLANT
GLOVE BIOGEL PI INDICATOR 6.5 (GLOVE) ×2
GLOVE BIOGEL PI INDICATOR 7.5 (GLOVE) ×2
GLOVE BIOGEL PI INDICATOR 8.5 (GLOVE) ×2
GLOVE ECLIPSE 8.0 STRL XLNG CF (GLOVE) ×3 IMPLANT
GLOVE ORTHO TXT STRL SZ7.5 (GLOVE) ×3 IMPLANT
GOWN STRL REUS W/ TWL LRG LVL3 (GOWN DISPOSABLE) ×1 IMPLANT
GOWN STRL REUS W/TWL 2XL LVL3 (GOWN DISPOSABLE) ×3 IMPLANT
GOWN STRL REUS W/TWL LRG LVL3 (GOWN DISPOSABLE) ×5 IMPLANT
HANDPIECE INTERPULSE COAX TIP (DISPOSABLE) ×2
HOLDER FOLEY CATH W/STRAP (MISCELLANEOUS) IMPLANT
KIT TURNOVER KIT A (KITS) IMPLANT
MANIFOLD NEPTUNE II (INSTRUMENTS) ×3 IMPLANT
NDL SAFETY ECLIPSE 18X1.5 (NEEDLE) IMPLANT
NEEDLE HYPO 18GX1.5 SHARP (NEEDLE)
NS IRRIG 1000ML POUR BTL (IV SOLUTION) ×3 IMPLANT
PACK TOTAL KNEE CUSTOM (KITS) ×3 IMPLANT
PIN DRILL FIX HALF THREAD (BIT) ×2 IMPLANT
PIN THREADED HEADED SIGMA (PIN) ×2 IMPLANT
PROTECTOR NERVE ULNAR (MISCELLANEOUS) ×3 IMPLANT
SET HNDPC FAN SPRY TIP SCT (DISPOSABLE) ×1 IMPLANT
SET PAD KNEE POSITIONER (MISCELLANEOUS) ×3 IMPLANT
SUT MNCRL AB 4-0 PS2 18 (SUTURE) ×3 IMPLANT
SUT STRATAFIX PDS+ 0 24IN (SUTURE) ×3 IMPLANT
SUT VIC AB 1 CT1 36 (SUTURE) ×3 IMPLANT
SUT VIC AB 2-0 CT1 27 (SUTURE) ×6
SUT VIC AB 2-0 CT1 TAPERPNT 27 (SUTURE) ×3 IMPLANT
SYR 3ML LL SCALE MARK (SYRINGE) ×3 IMPLANT
TIBIAL BASE ROT PLAT SZ 7 KNEE (Knees) ×3 IMPLANT
TRAY FOLEY MTR SLVR 16FR STAT (SET/KITS/TRAYS/PACK) ×3 IMPLANT
WATER STERILE IRR 1000ML POUR (IV SOLUTION) ×6 IMPLANT
WRAP KNEE MAXI GEL POST OP (GAUZE/BANDAGES/DRESSINGS) ×3 IMPLANT
YANKAUER SUCT BULB TIP 10FT TU (MISCELLANEOUS) ×3 IMPLANT

## 2019-07-14 NOTE — Progress Notes (Signed)
Assisted Dr. Brock with right, ultrasound guided, adductor canal block. Side rails up, monitors on throughout procedure. See vital signs in flow sheet. Tolerated Procedure well.  

## 2019-07-14 NOTE — Discharge Instructions (Signed)

## 2019-07-14 NOTE — Interval H&P Note (Signed)
History and Physical Interval Note:  07/14/2019 9:43 AM  [name]  has presented today for surgery, with the diagnosis of Right knee osteoarthritis.  The various methods of treatment have been discussed with the patient and family. After consideration of risks, benefits and other options for treatment, the patient has consented to  Procedure(s) with comments: TOTAL KNEE ARTHROPLASTY (Right) - 70 mins as a surgical intervention.  The patient's history has been reviewed, patient examined, no change in status, stable for surgery.  I have reviewed the patient's chart and labs.  Questions were answered to the patient's satisfaction.     [name]

## 2019-07-14 NOTE — Anesthesia Preprocedure Evaluation (Addendum)
Anesthesia Evaluation  Patient identified by MRN, date of birth, ID band Patient awake    Reviewed: Allergy & Precautions, NPO status , Patient's Chart, lab work & pertinent test results  History of Anesthesia Complications Negative for: history of anesthetic complications  Airway Mallampati: II  TM Distance: >3 FB Neck ROM: Full    Dental  (+) Dental Advisory Given, Partial Upper   Pulmonary former smoker,    Pulmonary exam normal        Cardiovascular hypertension, Pt. on medications (-) anginaNormal cardiovascular exam     Neuro/Psych negative neurological ROS  negative psych ROS   GI/Hepatic Neg liver ROS,  Hx esophageal spasms    Endo/Other   Obesity   Renal/GU negative Renal ROS     Musculoskeletal  (+) Arthritis ,   Abdominal (+) + obese,   Peds  Hematology negative hematology ROS (+)  Plt 173k    Anesthesia Other Findings   Reproductive/Obstetrics                            Anesthesia Physical Anesthesia Plan  ASA: II  Anesthesia Plan: Spinal   Post-op Pain Management:  Regional for Post-op pain   Induction:   PONV Risk Score and Plan: 1 and Treatment may vary due to age or medical condition and Propofol infusion  Airway Management Planned: Natural Airway and Simple Face Mask  Additional Equipment: None  Intra-op Plan:   Post-operative Plan:   Informed Consent: I have reviewed the patients History and Physical, chart, labs and discussed the procedure including the risks, benefits and alternatives for the proposed anesthesia with the patient or authorized representative who has indicated his/her understanding and acceptance.       Plan Discussed with: CRNA and Anesthesiologist  Anesthesia Plan Comments: (Labs reviewed, platelets acceptable. Discussed risks and benefits of spinal, including spinal/epidural hematoma, infection, failed block, and PDPH.  Patient expressed understanding and wished to proceed. )       Anesthesia Quick Evaluation

## 2019-07-14 NOTE — Anesthesia Procedure Notes (Signed)
Date/Time: 07/14/2019 10:42 AM Performed by: Talbot Grumbling, CRNA Oxygen Delivery Method: Simple face mask

## 2019-07-14 NOTE — Evaluation (Signed)
Physical Therapy Evaluation Patient Details Name: [name] MRN: [id number] DOB: Nov 29, 1945 Today's Date: 07/14/2019   History of Present Illness  R TKA  Clinical Impression  Pt is s/p TKA resulting in the deficits listed below (see PT Problem List). Pt ambulated 68' with RW, no loss of balance. Initiated TKA HEP. Good progress expected.  Pt will benefit from skilled PT to increase their independence and safety with mobility to allow discharge to the venue listed below.      Follow Up Recommendations Follow surgeon's recommendation for DC plan and follow-up therapies    Equipment Recommendations  None recommended by PT    Recommendations for Other Services       Precautions / Restrictions Precautions Precautions: Knee Precaution Comments: reviewed no pillow under knee Restrictions Weight Bearing Restrictions: No Other Position/Activity Restrictions: WBAT      Mobility  Bed Mobility Overal bed mobility: Needs Assistance Bed Mobility: Supine to Sit     Supine to sit: Mod assist     General bed mobility comments: assist to raise trunk  Transfers Overall transfer level: Needs assistance Equipment used: Rolling walker (2 wheeled) Transfers: Sit to/from Stand Sit to Stand: Min assist;From elevated surface         General transfer comment: VCs hand placement, min A to power up  Ambulation/Gait Ambulation/Gait assistance: Min guard Gait Distance (Feet): 80 Feet Assistive device: Rolling walker (2 wheeled) Gait Pattern/deviations: Step-to pattern;Decreased stride length Gait velocity: decr   General Gait Details: VCs sequencing, no loss of balance  Stairs            Wheelchair Mobility    Modified Rankin (Stroke Patients Only)       Balance Overall balance assessment: Modified Independent                                           Pertinent Vitals/Pain Pain Assessment: 0-10 Pain Score: 5  Pain Location: R knee Pain  Descriptors / Indicators: Throbbing;Dull Pain Intervention(s): Limited activity within patient's tolerance;Monitored during session;Premedicated before session;Ice applied    Home Living Family/patient expects to be discharged to:: Private residence Living Arrangements: Alone Available Help at Discharge: Friend(s) Type of Home: House Home Access: Level entry     Home Layout: Multi-level Home Equipment: Environmental consultant - 2 wheels;Cane - single point;Bedside commode      Prior Function Level of Independence: Independent               Hand Dominance        Extremity/Trunk Assessment   Upper Extremity Assessment Upper Extremity Assessment: Overall WFL for tasks assessed    Lower Extremity Assessment Lower Extremity Assessment: RLE deficits/detail RLE Deficits / Details: SLR +2/5, knee ext 3/5, knee AAROM 0-45* RLE Sensation: WNL    Cervical / Trunk Assessment Cervical / Trunk Assessment: Normal  Communication   Communication: No difficulties  Cognition Arousal/Alertness: Awake/alert Behavior During Therapy: WFL for tasks assessed/performed Overall Cognitive Status: Within Functional Limits for tasks assessed                                        General Comments      Exercises Total Joint Exercises Ankle Circles/Pumps: AROM;Both;10 reps;Supine Quad Sets: AROM;Right;5 reps;Supine Heel Slides: AAROM;Right;10 reps;Supine Long Arc Quad: AROM;Right;5 reps;Seated  Assessment/Plan    PT Assessment Patient needs continued PT services  PT Problem List Decreased strength;Decreased range of motion;Decreased activity tolerance;Decreased mobility;Pain;Decreased knowledge of use of DME       PT Treatment Interventions DME instruction;Gait training;Stair training;Functional mobility training;Therapeutic exercise;Therapeutic activities;Patient/family education    PT Goals (Current goals can be found in the Care Plan section)  Acute Rehab PT Goals Patient  Stated Goal: return to the gym PT Goal Formulation: With patient Time For Goal Achievement: 07/21/19 Potential to Achieve Goals: Good    Frequency 7X/week   Barriers to discharge        Co-evaluation               AM-PAC PT "6 Clicks" Mobility  Outcome Measure Help needed turning from your back to your side while in a flat bed without using bedrails?: A Little Help needed moving from lying on your back to sitting on the side of a flat bed without using bedrails?: A Little Help needed moving to and from a bed to a chair (including a wheelchair)?: A Little Help needed standing up from a chair using your arms (e.g., wheelchair or bedside chair)?: A Little Help needed to walk in hospital room?: A Little Help needed climbing 3-5 steps with a railing? : A Lot 6 Click Score: 17    End of Session Equipment Utilized During Treatment: Gait belt Activity Tolerance: Patient tolerated treatment well Patient left: in chair;with call bell/phone within reach Nurse Communication: Mobility status PT Visit Diagnosis: Muscle weakness (generalized) (M62.81);Difficulty in walking, not elsewhere classified (R26.2);Pain Pain - Right/Left: Right Pain - part of body: Knee    Time: 8756-4332 PT Time Calculation (min) (ACUTE ONLY): 33 min   Charges:   PT Evaluation $PT Eval Low Complexity: 1 Low PT Treatments $Gait Training: 8-22 mins        Blondell Reveal Kistler PT 07/14/2019  Acute Rehabilitation Services Pager [phone] Office [phone]

## 2019-07-14 NOTE — Transfer of Care (Signed)
Immediate Anesthesia Transfer of Care Note  Patient: [name]  Procedure(s) Performed: TOTAL KNEE ARTHROPLASTY (Right Knee)  Patient Location: PACU  Anesthesia Type:Spinal  Level of Consciousness: awake, alert  and oriented  Airway & Oxygen Therapy: Patient Spontanous Breathing and Patient connected to face mask oxygen  Post-op Assessment: Report given to RN and Post -op Vital signs reviewed and stable  Post vital signs: Reviewed and stable  Last Vitals:  Vitals Value Taken Time  BP 138/78 07/14/19 1222  Temp    Pulse 75 07/14/19 1223  Resp 14 07/14/19 1223  SpO2 100 % 07/14/19 1223  Vitals shown include unvalidated device data.  Last Pain:  Vitals:   07/14/19 0738  TempSrc: Oral      Patients Stated Pain Goal: 3 (92/11/94 1740)  Complications: No apparent anesthesia complications

## 2019-07-14 NOTE — Op Note (Signed)
NAME:  [name]                      MEDICAL RECORD NO.:  [id number]                             FACILITY:  [facility]      PHYSICIAN:  Pietro Cassis. Alvan Dame, M.D.  DATE OF BIRTH:  02-20-1946      DATE OF PROCEDURE:  07/14/2019                                     OPERATIVE REPORT         PREOPERATIVE DIAGNOSIS:  Right knee osteoarthritis.      POSTOPERATIVE DIAGNOSIS:  Right knee osteoarthritis.      FINDINGS:  The patient was noted to have complete loss of cartilage and   bone-on-bone arthritis with associated osteophytes in the medial and patellofemoral compartments of   the knee with a significant synovitis and associated effusion.  The patient had failed months of conservative treatment including medications, injection therapy, activity modification.     PROCEDURE:  Right total knee replacement.      COMPONENTS USED:  DePuy Attune rotating platform posterior stabilized knee   system, a size 6 femur, 7 tibia, size 6 mm PS AOX insert, and 38 anatomic patellar   button.      SURGEON:  Pietro Cassis. Alvan Dame, M.D.      ASSISTANT:  Danae Orleans, PA-C.      ANESTHESIA:  Regional and Spinal.      SPECIMENS:  None.      COMPLICATION:  None.      DRAINS:  None.  EBL: <100cc      TOURNIQUET TIME:  25 min at 250 mmHg   The patient was stable to the recovery room.      INDICATION FOR PROCEDURE:  [name] is a 73 y.o. male patient of   mine.  The patient had been seen, evaluated, and treated for months conservatively in the   office with medication, activity modification, and injections.  The patient had   radiographic changes of bone-on-bone arthritis with endplate sclerosis and osteophytes noted.  Based on the radiographic changes and failed conservative measures, the patient   decided to proceed with definitive treatment, total knee replacement.  Risks of infection, DVT, component failure, need for revision surgery, neurovascular injury were reviewed in the office setting.   The postop course was reviewed stressing the efforts to maximize post-operative satisfaction and function.  Consent was obtained for benefit of pain   relief.      PROCEDURE IN DETAIL:  The patient was brought to the operative theater.   Once adequate anesthesia, preoperative antibiotics, 2 gm of Ancef,1 gm of Tranexamic Acid, and 10 mg of Decadron administered, the patient was positioned supine with a right thigh tourniquet placed.  The  right lower extremity was prepped and draped in sterile fashion.  A time-   out was performed identifying the patient, planned procedure, and the appropriate extremity.      The right lower extremity was placed in the [facility] leg holder.  The leg was   exsanguinated, tourniquet elevated to 250 mmHg.  A midline incision was   made followed by median parapatellar arthrotomy.  Following initial   exposure, attention was first directed to  the patella.  Precut   measurement was noted to be 24 mm.  I resected down to 14-15 mm and used a   38 anatomic patellar button to restore patellar height as well as cover the cut surface.      The lug holes were drilled and a metal shim was placed to protect the   patella from retractors and saw blade during the procedure.      At this point, attention was now directed to the femur.  The femoral   canal was opened with a drill, irrigated to try to prevent fat emboli.  An   intramedullary rod was passed at 5 degrees valgus, 9 mm of bone was   resected off the distal femur.  Following this resection, the tibia was   subluxated anteriorly.  Using the extramedullary guide, 2 mm of bone was resected off   the proximal medial tibia.  We confirmed the gap would be   stable medially and laterally with a size 5 spacer block as well as confirmed that the tibial cut was perpendicular in the coronal plane, checking with an alignment rod.      Once this was done, I sized the femur to be a size 6 in the anterior-   posterior dimension,  chose a standard component based on medial and   lateral dimension.  The size 6 rotation block was then pinned in   position anterior referenced using the C-clamp to set rotation.  The   anterior, posterior, and  chamfer cuts were made without difficulty nor   notching making certain that I was along the anterior cortex to help   with flexion gap stability.      The final box cut was made off the lateral aspect of distal femur.      At this point, the tibia was sized to be a size 7.  The size 7 tray was   then pinned in position through the medial third of the tubercle,   drilled, and keel punched.  Trial reduction was now carried with a 6 femur,  7 tibia, a size 6 mm PS insert, and the 38 anatomic patella botton.  The knee was brought to full extension with good flexion stability with the patella   tracking through the trochlea without application of pressure.  Given   all these findings the trial components removed.  Final components were   opened and cement was mixed.  The knee was irrigated with normal saline solution and pulse lavage.  The synovial lining was   then injected with 30 cc of 0.25% Marcaine with epinephrine, 1 cc of Toradol and 30 cc of NS for a total of 61 cc.     Final implants were then cemented onto cleaned and dried cut surfaces of bone with the knee brought to extension with a size 6 mm PS trial insert.      Once the cement had fully cured, excess cement was removed   throughout the knee.  I confirmed that I was satisfied with the range of   motion and stability, and the final size 6 mm PS AOX insert was chosen.  It was   placed into the knee.      The tourniquet had been let down at 25 minutes.  No significant   hemostasis was required.  The extensor mechanism was then reapproximated using #1 Vicryl and #1 Stratafix sutures with the knee   in flexion.  The   remaining wound  was closed with 2-0 Vicryl and running 4-0 Monocryl.   The knee was cleaned, dried,  dressed sterilely using Dermabond and   Aquacel dressing.  The patient was then   brought to recovery room in stable condition, tolerating the procedure   well.   Please note that Physician Assistant, Lanney Gins, PA-C was present for the entirety of the case, and was utilized for pre-operative positioning, peri-operative retractor management, general facilitation of the procedure and for primary wound closure at the end of the case.              [name], M.D.    07/14/2019 10:45 AM

## 2019-07-14 NOTE — Anesthesia Procedure Notes (Signed)
Spinal  Patient location during procedure: OR Start time: 07/14/2019 10:36 AM End time: 07/14/2019 10:41 AM Staffing Anesthesiologist: Audry Pili, MD Performed: anesthesiologist  Preanesthetic Checklist Completed: patient identified, surgical consent, pre-op evaluation, timeout performed, IV checked, risks and benefits discussed and monitors and equipment checked Spinal Block Patient position: sitting Prep: DuraPrep Patient monitoring: heart rate, cardiac monitor, continuous pulse ox and blood pressure Approach: midline Location: L3-4 Injection technique: single-shot Needle Needle type: Pencan  Needle gauge: 24 G Additional Notes Consent was obtained prior to the procedure with all questions answered and concerns addressed. Risks including, but not limited to, bleeding, infection, nerve damage, paralysis, failed block, inadequate analgesia, allergic reaction, high spinal, itching, and headache were discussed and the patient wished to proceed. Functioning IV was confirmed and monitors were applied. Sterile prep and drape, including hand hygiene, mask, and sterile gloves were used. The patient was positioned and the spine was prepped. The skin was anesthetized with lidocaine. Free flow of clear CSF was obtained prior to injecting local anesthetic into the CSF. The spinal needle aspirated freely following injection. The needle was carefully withdrawn. The patient tolerated the procedure well.   Renold Don, MD

## 2019-07-14 NOTE — Anesthesia Procedure Notes (Signed)
Anesthesia Regional Block: Adductor canal block   Pre-Anesthetic Checklist: ,, timeout performed, Correct Patient, Correct Site, Correct Laterality, Correct Procedure, Correct Position, site marked, Risks and benefits discussed,  Surgical consent,  Pre-op evaluation,  At surgeon's request and post-op pain management  Laterality: Right  Prep: chloraprep       Needles:  Injection technique: Single-shot  Needle Type: Echogenic Needle     Needle Length: 10cm  Needle Gauge: 21     Additional Needles:   Narrative:  Start time: 07/14/2019 10:09 AM End time: 07/14/2019 10:12 AM Injection made incrementally with aspirations every 5 mL.  Performed by: Personally  Anesthesiologist: Audry Pili, MD  Additional Notes: No pain on injection. No increased resistance to injection. Injection made in 5cc increments. Good needle visualization. Patient tolerated the procedure well.

## 2019-07-14 NOTE — Anesthesia Postprocedure Evaluation (Signed)
Anesthesia Post Note  Patient: [name]  Procedure(s) Performed: TOTAL KNEE ARTHROPLASTY (Right Knee)     Patient location during evaluation: PACU Anesthesia Type: Spinal Level of consciousness: oriented and awake and alert Pain management: pain level controlled Vital Signs Assessment: post-procedure vital signs reviewed and stable Respiratory status: spontaneous breathing, respiratory function stable and patient connected to nasal cannula oxygen Cardiovascular status: blood pressure returned to baseline and stable Postop Assessment: no headache, no backache and no apparent nausea or vomiting Anesthetic complications: no    Last Vitals:  Vitals:   07/14/19 1330 07/14/19 1346  BP: (!) 158/78 (!) 170/83  Pulse: 71 83  Resp: 15 16  Temp:  36.6 C  SpO2: 100% 100%    Last Pain:  Vitals:   07/14/19 1330  TempSrc:   PainSc: 0-No pain    LLE Motor Response: (P) Purposeful movement (07/14/19 1350) LLE Sensation: (P) Decreased;Numbness (07/14/19 1350) RLE Motor Response: (P) Purposeful movement (07/14/19 1350) RLE Sensation: (P) Decreased;Numbness (07/14/19 1350) L Sensory Level: (P) S1-Sole of foot, small toes (07/14/19 1350) R Sensory Level: (P) S1-Sole of foot, small toes (07/14/19 1350)  [name]

## 2019-07-15 ENCOUNTER — Encounter (HOSPITAL_COMMUNITY): Payer: Self-pay | Admitting: Orthopedic Surgery

## 2019-07-15 DIAGNOSIS — M1711 Unilateral primary osteoarthritis, right knee: Secondary | ICD-10-CM | POA: Diagnosis not present

## 2019-07-15 DIAGNOSIS — E669 Obesity, unspecified: Secondary | ICD-10-CM | POA: Diagnosis present

## 2019-07-15 LAB — BASIC METABOLIC PANEL
Anion gap: 12 (ref 5–15)
BUN: 17 mg/dL (ref 8–23)
CO2: 18 mmol/L — ABNORMAL LOW (ref 22–32)
Calcium: 8.3 mg/dL — ABNORMAL LOW (ref 8.9–10.3)
Chloride: 104 mmol/L (ref 98–111)
Creatinine, Ser: 0.94 mg/dL (ref 0.61–1.24)
GFR calc Af Amer: >60 mL/min (ref >60)
GFR calc non Af Amer: >60 mL/min (ref >60)
Glucose, Bld: 171 mg/dL — ABNORMAL HIGH (ref 70–99)
Potassium: 3.6 mmol/L (ref 3.5–5.1)
Sodium: 134 mmol/L — ABNORMAL LOW (ref 135–145)

## 2019-07-15 LAB — CBC
HCT: 34.2 % — ABNORMAL LOW (ref 39.0–52.0)
Hemoglobin: 11.5 g/dL — ABNORMAL LOW (ref 13.0–17.0)
MCH: 31.3 pg (ref 26.0–34.0)
MCHC: 33.6 g/dL (ref 30.0–36.0)
MCV: 92.9 fL (ref 80.0–100.0)
Platelets: 147 10*3/uL — ABNORMAL LOW (ref 150–400)
RBC: 3.68 MIL/uL — ABNORMAL LOW (ref 4.22–5.81)
RDW: 15.5 % (ref 11.5–15.5)
WBC: 14.3 10*3/uL — ABNORMAL HIGH (ref 4.0–10.5)
nRBC: 0 % (ref 0.0–0.2)

## 2019-07-15 MED ORDER — FERROUS SULFATE 325 (65 FE) MG PO TABS: 325.0000 mg | ORAL_TABLET | Freq: Three times a day (TID) | ORAL | 0 refills | Status: AC

## 2019-07-15 MED ORDER — METHOCARBAMOL 500 MG PO TABS: 500.0000 mg | ORAL_TABLET | Freq: Four times a day (QID) | ORAL | 0 refills | Status: AC | PRN

## 2019-07-15 MED ORDER — OXYCODONE HCL 10 MG PO TABS: 10.0000 mg | ORAL_TABLET | ORAL | 0 refills | Status: AC | PRN

## 2019-07-15 MED ORDER — POLYETHYLENE GLYCOL 3350 17 G PO PACK: 17.0000 g | PACK | Freq: Two times a day (BID) | ORAL | 0 refills | Status: AC

## 2019-07-15 MED ORDER — ACETAMINOPHEN 500 MG PO TABS: 1000.0000 mg | ORAL_TABLET | Freq: Three times a day (TID) | ORAL | 0 refills | Status: AC

## 2019-07-15 MED ORDER — DOCUSATE SODIUM 100 MG PO CAPS: 100.0000 mg | ORAL_CAPSULE | Freq: Two times a day (BID) | ORAL | 0 refills | Status: AC

## 2019-07-15 MED ORDER — ASPIRIN 81 MG PO CHEW: 81.0000 mg | CHEWABLE_TABLET | Freq: Two times a day (BID) | ORAL | 0 refills | Status: AC

## 2019-07-15 NOTE — Progress Notes (Signed)
     Subjective: 1 Day Post-Op Procedure(s) (LRB): TOTAL KNEE ARTHROPLASTY (Right)   Seen by Dr. Alvan Dame. Patient reports pain as moderate, pain control medication.  No events reported throughout the night.  Dr. Alvan Dame did discussed the procedure, findings and expectations moving forward.  Patient is looking forward to progressing with physical therapy.  Patient is ready be discharged home, if he does well with therapy.  Patient will follow up in the clinic in 2 weeks.  The patient knows to call with any questions or concerns.   Patient's anticipated LOS is less than 2 midnights, meeting these requirements: - Lives within 1 hour of care - Has a competent adult at home to recover with post-op recover - NO history of  - Diabetes  - Coronary Artery Disease  - Heart failure  - Heart attack  - Stroke  - DVT/VTE  - Cardiac arrhythmia  - Respiratory Failure/COPD  - Renal failure  - Anemia  - Advanced Liver disease     Objective:   VITALS:   Vitals:   07/15/19 0122 07/15/19 0502  BP: (!) 157/81 (!) 168/85  Pulse: 79 79  Resp: 16 16  Temp: 98 F (36.7 C) 97.7 F (36.5 C)  SpO2: 99% 99%    Dorsiflexion/Plantar flexion intact Incision: dressing C/D/I No cellulitis present Compartment soft  LABS Recent Labs    07/15/19 0238  HGB 11.5*  HCT 34.2*  WBC 14.3*  PLT 147*    Recent Labs    07/15/19 0238  NA 134*  K 3.6  BUN 17  CREATININE 0.94  GLUCOSE 171*     Assessment/Plan: 1 Day Post-Op Procedure(s) (LRB): TOTAL KNEE ARTHROPLASTY (Right) Foley cath d/c'ed Advance diet Up with therapy D/C IV fluids Discharge home Follow up in 2 weeks at [facility] Follow up with OLIN,Jaydalee Bardwell D in 2 weeks.  Contact information:  [facility] [address], [address] [phone]    Obese (BMI 30-39.9) Estimated body mass index is 34.77 kg/m as calculated from the following:   Height as of this encounter: 5\' 10"  (1.778 m).  Weight as of this encounter: 109.9 kg. Patient also counseled that weight may inhibit the healing process Patient counseled that losing weight will help with future health issues         West Pugh. Winnell Bento   PAC  07/15/2019, 8:39 AM

## 2019-07-15 NOTE — Plan of Care (Signed)
resolved 

## 2019-07-15 NOTE — Progress Notes (Signed)
Therapy Plan: OPPT Has DME  

## 2019-07-15 NOTE — Progress Notes (Signed)
Physical Therapy Treatment Patient Details Name: [name] MRN: [id number] DOB: 10/19/1945 Today's Date: 07/15/2019    History of Present Illness R TKA    PT Comments    Pt progressing well; reviewed transfer safety, HEP, stairs and gait. Ready for d/c from PT standpoint; emphasized using RW and slowing speed of movement to incr safety   Follow Up Recommendations  Follow surgeon's recommendation for DC plan and follow-up therapies     Equipment Recommendations  None recommended by PT    Recommendations for Other Services       Precautions / Restrictions Precautions Precautions: Knee Precaution Comments: reviewed no pillow under knee Restrictions Weight Bearing Restrictions: No Other Position/Activity Restrictions: WBAT    Mobility  Bed Mobility Overal bed mobility: Needs Assistance Bed Mobility: Supine to Sit     Supine to sit: Supervision     General bed mobility comments: for safety  Transfers Overall transfer level: Needs assistance Equipment used: Rolling walker (2 wheeled) Transfers: Sit to/from Stand Sit to Stand: Supervision         General transfer comment: cues for  hand placement   Ambulation/Gait Ambulation/Gait assistance: Supervision Gait Distance (Feet): 200 Feet Assistive device: Rolling walker (2 wheeled) Gait Pattern/deviations: Step-to pattern;Decreased stride length     General Gait Details: cues for RW safety, distance from self    Stairs Stairs: Yes Stairs assistance: Min guard Stair Management: One rail Right;One rail Left;Two rails;Step to pattern Number of Stairs: 14 General stair comments: cues for sequence and safety   Wheelchair Mobility    Modified Rankin (Stroke Patients Only)       Balance                                            Cognition Arousal/Alertness: Awake/alert Behavior During Therapy: WFL for tasks assessed/performed Overall Cognitive Status: Within Functional Limits  for tasks assessed                                        Exercises Total Joint Exercises Ankle Circles/Pumps: AROM;Both;10 reps Quad Sets: AROM;Both;10 reps Short Arc Quad: Right;15 reps;AROM Heel Slides: AAROM;Right;10 reps Hip ABduction/ADduction: AROM;AAROM;Right;10 reps Straight Leg Raises: AROM;Right;10 reps Goniometric ROM: grossly 8 to 65 degrees AAROM    General Comments        Pertinent Vitals/Pain Pain Assessment: 0-10 Pain Score: 4  Pain Location: R knee Pain Descriptors / Indicators: Throbbing;Dull Pain Intervention(s): Limited activity within patient's tolerance;Monitored during session;Premedicated before session;Repositioned;Ice applied    Home Living                      Prior Function            PT Goals (current goals can now be found in the care plan section) Acute Rehab PT Goals Patient Stated Goal: return to the gym PT Goal Formulation: With patient Time For Goal Achievement: 07/21/19 Potential to Achieve Goals: Good Progress towards PT goals: Progressing toward goals    Frequency    7X/week      PT Plan Current plan remains appropriate    Co-evaluation              AM-PAC PT "6 Clicks" Mobility   Outcome Measure  Help needed turning from your back  to your side while in a flat bed without using bedrails?: None Help needed moving from lying on your back to sitting on the side of a flat bed without using bedrails?: None Help needed moving to and from a bed to a chair (including a wheelchair)?: A Little Help needed standing up from a chair using your arms (e.g., wheelchair or bedside chair)?: A Little Help needed to walk in hospital room?: A Little Help needed climbing 3-5 steps with a railing? : A Little 6 Click Score: 20    End of Session Equipment Utilized During Treatment: Gait belt Activity Tolerance: Patient tolerated treatment well Patient left: in chair;with call bell/phone within reach;with  family/visitor present;with chair alarm set Nurse Communication: Mobility status PT Visit Diagnosis: Muscle weakness (generalized) (M62.81);Difficulty in walking, not elsewhere classified (R26.2);Pain Pain - Right/Left: Right Pain - part of body: Knee     Time: 6283-6629 PT Time Calculation (min) (ACUTE ONLY): 28 min  Charges:  $Gait Training: 8-22 mins $Therapeutic Exercise: 8-22 mins                     Kenyon Ana, PT  Pager: [phone] Acute Rehab Dept [facility]): [phone]   07/15/2019    [facility] 07/15/2019, 10:25 AM

## 2019-07-16 NOTE — Discharge Summary (Signed)
Physician Discharge Summary  Patient ID: [name] MRN: [id number] DOB/AGE: Feb 02, 1946 73 y.o.  Admit date: 07/14/2019 Discharge date: 07/15/2019   Procedures:  Procedure(s) (LRB): TOTAL KNEE ARTHROPLASTY (Right)  Attending Physician:  Dr. Durene RomansMatthew Olin   Admission Diagnoses:   Right knee primary OA / pain  Discharge Diagnoses:  Active Problems:   Status post total right knee replacement   Obese  Past Medical History:  Diagnosis Date  . Arthritis    PAIN AND OA LEFT HIP--SOME PROBLEMS WITH RT HIP BUT NOTHING LIKE THE LEFT HIP  . Heart murmur 07/02/2019   Dr. Sol Passerough heard a mild murmur but was not concerned  . Hypertension   . Rash, skin     RASH ON LEGS--ITCHING -EVERY WINTER.  Marland Kitchen. Shortness of breath    JUST WITH EXERTION  . Spastic esophagus    HX OF CHEST PAIN--STATES WENT TO HOSPTIAL YRS AGO--TOLD HEART OK--PAIN ATTRIBUTED TO SPASMS OF ESOPHAGUS    HPI:    [name], 73 y.o. male, has a history of pain and functional disability in the right knee due to arthritis and has failed non-surgical conservative treatments for greater than 12 weeks to include NSAID's and/or analgesics, corticosteriod injections, viscosupplementation injections and activity modification.  Onset of symptoms was gradual, starting  years ago with gradually worsening course since that time. The patient noted no past surgery on the right knee(s).  Patient currently rates pain in the right knee(s) at 10 out of 10 with activity. Patient has night pain, worsening of pain with activity and weight bearing, pain that interferes with activities of daily living, pain with passive range of motion, crepitus and joint swelling.  Patient has evidence of periarticular osteophytes and joint space narrowing by imaging studies. There is no active infection.  Risks, benefits and expectations were discussed with the patient.  Risks including but not limited to the risk of anesthesia, blood clots, nerve damage,  blood vessel damage, failure of the prosthesis, infection and up to and including death.  Patient understand the risks, benefits and expectations and wishes to proceed with surgery.   PCP: Olive Bassough, Robert L, MD   Discharged Condition: good  Hospital Course:  Patient underwent the above stated procedure on 07/14/2019. Patient tolerated the procedure well and brought to the recovery room in good condition and subsequently to the floor.  POD #1 BP: 168/85 ; Pulse: 79 ; Temp: 97.7 F (36.5 C) ; Resp: 16 Patient reports pain as moderate, pain control medication.  No events reported throughout the night.  Dr. Charlann Boxerlin did discussed the procedure, findings and expectations moving forward.  Patient is looking forward to progressing with physical therapy.  Patient is ready be discharged home. Dorsiflexion/plantar flexion intact, incision: dressing C/D/I, no cellulitis present and compartment soft.   LABS  Basename    HGB     11.5  HCT     34.2    Discharge Exam: General appearance: alert, cooperative and no distress Extremities: Homans sign is negative, no sign of DVT, no edema, redness or tenderness in the calves or thighs and no ulcers, gangrene or trophic changes  Disposition:  Home with follow up in 2 weeks   Follow-up Information    Durene Romanslin, Jaccob Czaplicki, MD. Schedule an appointment as soon as possible for a visit in 2 weeks.   Specialty: Orthopedic Surgery Contact information: [address] [place] [id number] [phone]           Discharge Instructions  Call MD / Call 911   Complete by: As directed    If you experience chest pain or shortness of breath, CALL 911 and be transported to the hospital emergency room.  If you develope a fever above 101 F, pus (white drainage) or increased drainage or redness at the wound, or calf pain, call your surgeon's office.   Change dressing   Complete by: As directed    Maintain surgical dressing until follow up in the clinic. If  the edges start to pull up, may reinforce with tape. If the dressing is no longer working, may remove and cover with gauze and tape, but must keep the area dry and clean.  Call with any questions or concerns.   Constipation Prevention   Complete by: As directed    Drink plenty of fluids.  Prune juice may be helpful.  You may use a stool softener, such as Colace (over the counter) 100 mg twice a day.  Use MiraLax (over the counter) for constipation as needed.   Diet - low sodium heart healthy   Complete by: As directed    Discharge instructions   Complete by: As directed    Maintain surgical dressing until follow up in the clinic. If the edges start to pull up, may reinforce with tape. If the dressing is no longer working, may remove and cover with gauze and tape, but must keep the area dry and clean.  Follow up in 2 weeks at [facility]. Call with any questions or concerns.   Increase activity slowly as tolerated   Complete by: As directed    Weight bearing as tolerated with assist device (walker, cane, etc) as directed, use it as long as suggested by your surgeon or therapist, typically at least 4-6 weeks.   TED hose   Complete by: As directed    Use stockings (TED hose) for 2 weeks on both leg(s).  You may remove them at night for sleeping.      Allergies as of 07/15/2019   No Known Allergies     Medication List    STOP taking these medications   aspirin EC 81 MG tablet Replaced by: aspirin 81 MG chewable tablet     TAKE these medications   acetaminophen 500 MG tablet Commonly known as: TYLENOL Take 2 tablets (1,000 mg total) by mouth every 8 (eight) hours.   amLODipine 5 MG tablet Commonly known as: NORVASC Take 5 mg by mouth daily.   aspirin 81 MG chewable tablet Commonly known as: Aspirin Childrens Chew 1 tablet (81 mg total) by mouth 2 (two) times daily. Take for 4 weeks, then resume regular dose. Replaces: aspirin EC 81 MG tablet   atorvastatin 20 MG  tablet Commonly known as: LIPITOR Take 20 mg by mouth daily.   CLEAR EYES FOR DRY EYES OP Place 1 drop into both eyes daily as needed (dry eyes).   docusate sodium 100 MG capsule Commonly known as: Colace Take 1 capsule (100 mg total) by mouth 2 (two) times daily.   ferrous sulfate 325 (65 FE) MG tablet Commonly known as: FerrouSul Take 1 tablet (325 mg total) by mouth 3 (three) times daily with meals for 14 days.   hydrocortisone cream 1 % Apply 1 application topically 2 (two) times daily as needed for itching.   irbesartan-hydrochlorothiazide 300-12.5 MG tablet Commonly known as: AVALIDE Take 1 tablet by mouth daily.   loratadine 10 MG tablet Commonly known as: CLARITIN Take 10 mg by mouth daily  as needed for allergies.   methocarbamol 500 MG tablet Commonly known as: Robaxin Take 1 tablet (500 mg total) by mouth every 6 (six) hours as needed for muscle spasms.   multivitamin with minerals Tabs tablet Take 1 tablet by mouth every morning.   Oxycodone HCl 10 MG Tabs Take 1-2 tablets (10-20 mg total) by mouth every 4 (four) hours as needed for moderate pain or severe pain. What changed:   how much to take  when to take this  reasons to take this   oxymetazoline 0.05 % nasal spray Commonly known as: AFRIN Place 1 spray into both nostrils at bedtime as needed for congestion.   polyethylene glycol 17 g packet Commonly known as: MIRALAX / GLYCOLAX Take 17 g by mouth 2 (two) times daily.   sildenafil 20 MG tablet Commonly known as: REVATIO Take 60 mg by mouth daily as needed (ED).   vitamin C 1000 MG tablet Take 1,000 mg by mouth every morning.            Discharge Care Instructions  (From admission, onward)         Start     Ordered   07/15/19 0000  Change dressing    Comments: Maintain surgical dressing until follow up in the clinic. If the edges start to pull up, may reinforce with tape. If the dressing is no longer working, may remove and cover  with gauze and tape, but must keep the area dry and clean.  Call with any questions or concerns.   07/15/19 0844           Signed: West Pugh. Benigno Check   PA-C  07/16/2019, 10:29 AM

## 2019-07-17 DIAGNOSIS — M25561 Pain in right knee: Secondary | ICD-10-CM | POA: Diagnosis not present

## 2019-07-17 DIAGNOSIS — M25661 Stiffness of right knee, not elsewhere classified: Secondary | ICD-10-CM | POA: Diagnosis not present

## 2019-07-21 DIAGNOSIS — M25561 Pain in right knee: Secondary | ICD-10-CM | POA: Diagnosis not present

## 2019-07-21 DIAGNOSIS — M25661 Stiffness of right knee, not elsewhere classified: Secondary | ICD-10-CM | POA: Diagnosis not present

## 2019-07-23 DIAGNOSIS — M25661 Stiffness of right knee, not elsewhere classified: Secondary | ICD-10-CM | POA: Diagnosis not present

## 2019-07-23 DIAGNOSIS — M25561 Pain in right knee: Secondary | ICD-10-CM | POA: Diagnosis not present

## 2019-07-28 DIAGNOSIS — M25561 Pain in right knee: Secondary | ICD-10-CM | POA: Diagnosis not present

## 2019-07-28 DIAGNOSIS — M25661 Stiffness of right knee, not elsewhere classified: Secondary | ICD-10-CM | POA: Diagnosis not present

## 2019-07-31 DIAGNOSIS — M25661 Stiffness of right knee, not elsewhere classified: Secondary | ICD-10-CM | POA: Diagnosis not present

## 2019-07-31 DIAGNOSIS — M25561 Pain in right knee: Secondary | ICD-10-CM | POA: Diagnosis not present

## 2019-08-04 DIAGNOSIS — M25561 Pain in right knee: Secondary | ICD-10-CM | POA: Diagnosis not present

## 2019-08-04 DIAGNOSIS — M25661 Stiffness of right knee, not elsewhere classified: Secondary | ICD-10-CM | POA: Diagnosis not present

## 2019-08-05 DIAGNOSIS — Z471 Aftercare following joint replacement surgery: Secondary | ICD-10-CM | POA: Diagnosis not present

## 2019-08-05 DIAGNOSIS — Z96651 Presence of right artificial knee joint: Secondary | ICD-10-CM | POA: Diagnosis not present

## 2019-08-06 DIAGNOSIS — M25561 Pain in right knee: Secondary | ICD-10-CM | POA: Diagnosis not present

## 2019-08-06 DIAGNOSIS — M25661 Stiffness of right knee, not elsewhere classified: Secondary | ICD-10-CM | POA: Diagnosis not present

## 2019-08-07 DIAGNOSIS — Z0000 Encounter for general adult medical examination without abnormal findings: Secondary | ICD-10-CM | POA: Diagnosis not present

## 2019-08-10 DIAGNOSIS — M25561 Pain in right knee: Secondary | ICD-10-CM | POA: Diagnosis not present

## 2019-08-10 DIAGNOSIS — M25661 Stiffness of right knee, not elsewhere classified: Secondary | ICD-10-CM | POA: Diagnosis not present

## 2019-08-11 DIAGNOSIS — G894 Chronic pain syndrome: Secondary | ICD-10-CM | POA: Diagnosis not present

## 2019-08-11 DIAGNOSIS — M4726 Other spondylosis with radiculopathy, lumbar region: Secondary | ICD-10-CM | POA: Diagnosis not present

## 2019-08-19 DIAGNOSIS — M25661 Stiffness of right knee, not elsewhere classified: Secondary | ICD-10-CM | POA: Diagnosis not present

## 2019-08-19 DIAGNOSIS — M25561 Pain in right knee: Secondary | ICD-10-CM | POA: Diagnosis not present

## 2019-08-26 DIAGNOSIS — Z96651 Presence of right artificial knee joint: Secondary | ICD-10-CM | POA: Diagnosis not present

## 2019-08-26 DIAGNOSIS — Z471 Aftercare following joint replacement surgery: Secondary | ICD-10-CM | POA: Diagnosis not present

## 2019-09-09 DIAGNOSIS — M19041 Primary osteoarthritis, right hand: Secondary | ICD-10-CM | POA: Diagnosis not present

## 2019-10-09 DIAGNOSIS — G894 Chronic pain syndrome: Secondary | ICD-10-CM | POA: Diagnosis not present

## 2019-10-09 DIAGNOSIS — M4726 Other spondylosis with radiculopathy, lumbar region: Secondary | ICD-10-CM | POA: Diagnosis not present

## 2019-11-24 DIAGNOSIS — Z83518 Family history of other specified eye disorder: Secondary | ICD-10-CM | POA: Diagnosis not present

## 2019-11-24 DIAGNOSIS — H04129 Dry eye syndrome of unspecified lacrimal gland: Secondary | ICD-10-CM | POA: Diagnosis not present

## 2019-11-24 DIAGNOSIS — H25813 Combined forms of age-related cataract, bilateral: Secondary | ICD-10-CM | POA: Diagnosis not present

## 2019-11-24 DIAGNOSIS — H3562 Retinal hemorrhage, left eye: Secondary | ICD-10-CM | POA: Diagnosis not present

## 2019-12-08 DIAGNOSIS — M4726 Other spondylosis with radiculopathy, lumbar region: Secondary | ICD-10-CM | POA: Diagnosis not present

## 2019-12-08 DIAGNOSIS — Z79899 Other long term (current) drug therapy: Secondary | ICD-10-CM | POA: Diagnosis not present

## 2019-12-08 DIAGNOSIS — Z79891 Long term (current) use of opiate analgesic: Secondary | ICD-10-CM | POA: Diagnosis not present

## 2019-12-08 DIAGNOSIS — I10 Essential (primary) hypertension: Secondary | ICD-10-CM | POA: Diagnosis not present

## 2019-12-08 DIAGNOSIS — Z6829 Body mass index (BMI) 29.0-29.9, adult: Secondary | ICD-10-CM | POA: Diagnosis not present

## 2019-12-08 DIAGNOSIS — G894 Chronic pain syndrome: Secondary | ICD-10-CM | POA: Diagnosis not present

## 2019-12-16 DIAGNOSIS — F410 Panic disorder [episodic paroxysmal anxiety] without agoraphobia: Secondary | ICD-10-CM | POA: Diagnosis not present

## 2019-12-16 DIAGNOSIS — Z125 Encounter for screening for malignant neoplasm of prostate: Secondary | ICD-10-CM | POA: Diagnosis not present

## 2019-12-16 DIAGNOSIS — E669 Obesity, unspecified: Secondary | ICD-10-CM | POA: Diagnosis not present

## 2019-12-16 DIAGNOSIS — I10 Essential (primary) hypertension: Secondary | ICD-10-CM | POA: Diagnosis not present

## 2019-12-16 DIAGNOSIS — Z79899 Other long term (current) drug therapy: Secondary | ICD-10-CM | POA: Diagnosis not present

## 2019-12-16 DIAGNOSIS — Z131 Encounter for screening for diabetes mellitus: Secondary | ICD-10-CM | POA: Diagnosis not present

## 2019-12-16 DIAGNOSIS — Z6837 Body mass index (BMI) 37.0-37.9, adult: Secondary | ICD-10-CM | POA: Diagnosis not present

## 2019-12-16 DIAGNOSIS — E782 Mixed hyperlipidemia: Secondary | ICD-10-CM | POA: Diagnosis not present

## 2019-12-16 DIAGNOSIS — J479 Bronchiectasis, uncomplicated: Secondary | ICD-10-CM | POA: Diagnosis not present

## 2019-12-16 DIAGNOSIS — R7303 Prediabetes: Secondary | ICD-10-CM | POA: Diagnosis not present

## 2019-12-16 DIAGNOSIS — Z0000 Encounter for general adult medical examination without abnormal findings: Secondary | ICD-10-CM | POA: Diagnosis not present

## 2020-02-08 DIAGNOSIS — I10 Essential (primary) hypertension: Secondary | ICD-10-CM | POA: Diagnosis not present

## 2020-02-08 DIAGNOSIS — G894 Chronic pain syndrome: Secondary | ICD-10-CM | POA: Diagnosis not present

## 2020-02-08 DIAGNOSIS — Z6829 Body mass index (BMI) 29.0-29.9, adult: Secondary | ICD-10-CM | POA: Diagnosis not present

## 2020-02-08 DIAGNOSIS — M4726 Other spondylosis with radiculopathy, lumbar region: Secondary | ICD-10-CM | POA: Diagnosis not present

## 2020-02-22 DIAGNOSIS — E782 Mixed hyperlipidemia: Secondary | ICD-10-CM | POA: Diagnosis not present

## 2020-04-07 DIAGNOSIS — G894 Chronic pain syndrome: Secondary | ICD-10-CM | POA: Diagnosis not present

## 2020-04-07 DIAGNOSIS — M4726 Other spondylosis with radiculopathy, lumbar region: Secondary | ICD-10-CM | POA: Diagnosis not present

## 2020-06-06 DIAGNOSIS — Z79891 Long term (current) use of opiate analgesic: Secondary | ICD-10-CM | POA: Diagnosis not present

## 2020-06-06 DIAGNOSIS — G894 Chronic pain syndrome: Secondary | ICD-10-CM | POA: Diagnosis not present

## 2020-06-06 DIAGNOSIS — M4726 Other spondylosis with radiculopathy, lumbar region: Secondary | ICD-10-CM | POA: Diagnosis not present

## 2020-06-06 DIAGNOSIS — Z79899 Other long term (current) drug therapy: Secondary | ICD-10-CM | POA: Diagnosis not present

## 2020-06-17 DIAGNOSIS — I10 Essential (primary) hypertension: Secondary | ICD-10-CM | POA: Diagnosis not present

## 2020-06-17 DIAGNOSIS — Z0000 Encounter for general adult medical examination without abnormal findings: Secondary | ICD-10-CM | POA: Diagnosis not present

## 2020-06-17 DIAGNOSIS — Z131 Encounter for screening for diabetes mellitus: Secondary | ICD-10-CM | POA: Diagnosis not present

## 2020-06-17 DIAGNOSIS — F410 Panic disorder [episodic paroxysmal anxiety] without agoraphobia: Secondary | ICD-10-CM | POA: Diagnosis not present

## 2020-06-17 DIAGNOSIS — R7301 Impaired fasting glucose: Secondary | ICD-10-CM | POA: Diagnosis not present

## 2020-06-17 DIAGNOSIS — E782 Mixed hyperlipidemia: Secondary | ICD-10-CM | POA: Diagnosis not present

## 2020-08-04 DIAGNOSIS — M4726 Other spondylosis with radiculopathy, lumbar region: Secondary | ICD-10-CM | POA: Diagnosis not present

## 2020-08-04 DIAGNOSIS — G894 Chronic pain syndrome: Secondary | ICD-10-CM | POA: Diagnosis not present

## 2020-09-05 DIAGNOSIS — U071 COVID-19: Secondary | ICD-10-CM | POA: Diagnosis not present

## 2020-09-12 DIAGNOSIS — J1282 Pneumonia due to coronavirus disease 2019: Secondary | ICD-10-CM | POA: Diagnosis not present

## 2020-09-12 DIAGNOSIS — U071 COVID-19: Secondary | ICD-10-CM | POA: Diagnosis not present

## 2020-10-04 DIAGNOSIS — M4726 Other spondylosis with radiculopathy, lumbar region: Secondary | ICD-10-CM | POA: Diagnosis not present

## 2020-10-04 DIAGNOSIS — G894 Chronic pain syndrome: Secondary | ICD-10-CM | POA: Diagnosis not present

## 2020-12-09 DIAGNOSIS — Z79899 Other long term (current) drug therapy: Secondary | ICD-10-CM | POA: Diagnosis not present

## 2020-12-09 DIAGNOSIS — G894 Chronic pain syndrome: Secondary | ICD-10-CM | POA: Diagnosis not present

## 2020-12-09 DIAGNOSIS — I10 Essential (primary) hypertension: Secondary | ICD-10-CM | POA: Diagnosis not present

## 2020-12-09 DIAGNOSIS — Z79891 Long term (current) use of opiate analgesic: Secondary | ICD-10-CM | POA: Diagnosis not present

## 2020-12-09 DIAGNOSIS — Z6829 Body mass index (BMI) 29.0-29.9, adult: Secondary | ICD-10-CM | POA: Diagnosis not present

## 2020-12-23 DIAGNOSIS — R7301 Impaired fasting glucose: Secondary | ICD-10-CM | POA: Diagnosis not present

## 2020-12-23 DIAGNOSIS — E782 Mixed hyperlipidemia: Secondary | ICD-10-CM | POA: Diagnosis not present

## 2020-12-23 DIAGNOSIS — F410 Panic disorder [episodic paroxysmal anxiety] without agoraphobia: Secondary | ICD-10-CM | POA: Diagnosis not present

## 2020-12-23 DIAGNOSIS — Z125 Encounter for screening for malignant neoplasm of prostate: Secondary | ICD-10-CM | POA: Diagnosis not present

## 2020-12-23 DIAGNOSIS — E669 Obesity, unspecified: Secondary | ICD-10-CM | POA: Diagnosis not present

## 2020-12-23 DIAGNOSIS — Z0000 Encounter for general adult medical examination without abnormal findings: Secondary | ICD-10-CM | POA: Diagnosis not present

## 2020-12-23 DIAGNOSIS — I10 Essential (primary) hypertension: Secondary | ICD-10-CM | POA: Diagnosis not present

## 2020-12-23 DIAGNOSIS — R0600 Dyspnea, unspecified: Secondary | ICD-10-CM | POA: Diagnosis not present

## 2021-01-06 DIAGNOSIS — R0600 Dyspnea, unspecified: Secondary | ICD-10-CM | POA: Diagnosis not present

## 2021-02-08 DIAGNOSIS — M4726 Other spondylosis with radiculopathy, lumbar region: Secondary | ICD-10-CM | POA: Diagnosis not present

## 2021-02-08 DIAGNOSIS — G894 Chronic pain syndrome: Secondary | ICD-10-CM | POA: Diagnosis not present

## 2021-03-30 DIAGNOSIS — R748 Abnormal levels of other serum enzymes: Secondary | ICD-10-CM | POA: Diagnosis not present

## 2021-04-11 DIAGNOSIS — Z6829 Body mass index (BMI) 29.0-29.9, adult: Secondary | ICD-10-CM | POA: Diagnosis not present

## 2021-04-11 DIAGNOSIS — I10 Essential (primary) hypertension: Secondary | ICD-10-CM | POA: Diagnosis not present

## 2021-04-11 DIAGNOSIS — M4726 Other spondylosis with radiculopathy, lumbar region: Secondary | ICD-10-CM | POA: Diagnosis not present

## 2021-04-11 DIAGNOSIS — G894 Chronic pain syndrome: Secondary | ICD-10-CM | POA: Diagnosis not present

## 2021-06-12 DIAGNOSIS — M4726 Other spondylosis with radiculopathy, lumbar region: Secondary | ICD-10-CM | POA: Diagnosis not present

## 2021-06-12 DIAGNOSIS — Z79899 Other long term (current) drug therapy: Secondary | ICD-10-CM | POA: Diagnosis not present

## 2021-06-12 DIAGNOSIS — G894 Chronic pain syndrome: Secondary | ICD-10-CM | POA: Diagnosis not present

## 2021-06-12 DIAGNOSIS — Z79891 Long term (current) use of opiate analgesic: Secondary | ICD-10-CM | POA: Diagnosis not present

## 2021-08-14 DIAGNOSIS — M4726 Other spondylosis with radiculopathy, lumbar region: Secondary | ICD-10-CM | POA: Diagnosis not present

## 2021-08-14 DIAGNOSIS — G894 Chronic pain syndrome: Secondary | ICD-10-CM | POA: Diagnosis not present

## 2021-09-01 DIAGNOSIS — H9190 Unspecified hearing loss, unspecified ear: Secondary | ICD-10-CM | POA: Diagnosis not present

## 2021-09-01 DIAGNOSIS — J479 Bronchiectasis, uncomplicated: Secondary | ICD-10-CM | POA: Diagnosis not present

## 2021-09-01 DIAGNOSIS — Z79891 Long term (current) use of opiate analgesic: Secondary | ICD-10-CM | POA: Diagnosis not present

## 2021-09-01 DIAGNOSIS — R7301 Impaired fasting glucose: Secondary | ICD-10-CM | POA: Diagnosis not present

## 2021-09-01 DIAGNOSIS — Z79899 Other long term (current) drug therapy: Secondary | ICD-10-CM | POA: Diagnosis not present

## 2021-09-01 DIAGNOSIS — F410 Panic disorder [episodic paroxysmal anxiety] without agoraphobia: Secondary | ICD-10-CM | POA: Diagnosis not present

## 2021-09-01 DIAGNOSIS — E782 Mixed hyperlipidemia: Secondary | ICD-10-CM | POA: Diagnosis not present

## 2021-09-01 DIAGNOSIS — Z0000 Encounter for general adult medical examination without abnormal findings: Secondary | ICD-10-CM | POA: Diagnosis not present

## 2021-09-01 DIAGNOSIS — R0609 Other forms of dyspnea: Secondary | ICD-10-CM | POA: Diagnosis not present

## 2021-09-01 DIAGNOSIS — I10 Essential (primary) hypertension: Secondary | ICD-10-CM | POA: Diagnosis not present

## 2022-11-16 DEATH — deceased
